# Patient Record
Sex: Female | Born: 1965 | Race: Black or African American | Hispanic: No | Marital: Single | Smoking: Former smoker
Health system: Southern US, Community
[De-identification: ages and names within clinical notes are randomized; demographics above are authoritative.]

## PROBLEM LIST (undated history)

## (undated) DIAGNOSIS — K589 Irritable bowel syndrome without diarrhea: Secondary | ICD-10-CM

## (undated) DIAGNOSIS — F32A Depression, unspecified: Secondary | ICD-10-CM

## (undated) DIAGNOSIS — F3162 Bipolar disorder, current episode mixed, moderate: Secondary | ICD-10-CM

## (undated) DIAGNOSIS — F329 Major depressive disorder, single episode, unspecified: Secondary | ICD-10-CM

## (undated) DIAGNOSIS — N840 Polyp of corpus uteri: Secondary | ICD-10-CM

## (undated) DIAGNOSIS — Z853 Personal history of malignant neoplasm of breast: Secondary | ICD-10-CM

## (undated) DIAGNOSIS — I10 Essential (primary) hypertension: Secondary | ICD-10-CM

## (undated) DIAGNOSIS — Z8742 Personal history of other diseases of the female genital tract: Secondary | ICD-10-CM

## (undated) DIAGNOSIS — F419 Anxiety disorder, unspecified: Secondary | ICD-10-CM

## (undated) HISTORY — DX: Depression, unspecified: F32.A

## (undated) HISTORY — DX: Essential (primary) hypertension: I10

## (undated) HISTORY — DX: Major depressive disorder, single episode, unspecified: F32.9

## (undated) HISTORY — PX: MASTECTOMY: SHX3

---

## 2008-05-06 ENCOUNTER — Emergency Department (HOSPITAL_COMMUNITY): Admission: EM | Admit: 2008-05-06 | Discharge: 2008-05-06 | Payer: Self-pay | Admitting: Emergency Medicine

## 2008-06-05 LAB — CONVERTED CEMR LAB: Pap Smear: NORMAL

## 2008-06-05 LAB — HM PAP SMEAR

## 2009-09-22 ENCOUNTER — Emergency Department (HOSPITAL_COMMUNITY): Admission: EM | Admit: 2009-09-22 | Discharge: 2009-09-22 | Payer: Self-pay | Admitting: Emergency Medicine

## 2009-10-04 ENCOUNTER — Ambulatory Visit: Payer: Self-pay | Admitting: Internal Medicine

## 2009-10-04 DIAGNOSIS — F17200 Nicotine dependence, unspecified, uncomplicated: Secondary | ICD-10-CM

## 2009-10-04 DIAGNOSIS — E669 Obesity, unspecified: Secondary | ICD-10-CM

## 2009-10-04 DIAGNOSIS — F329 Major depressive disorder, single episode, unspecified: Secondary | ICD-10-CM

## 2009-10-04 DIAGNOSIS — I10 Essential (primary) hypertension: Secondary | ICD-10-CM | POA: Insufficient documentation

## 2009-10-04 LAB — CONVERTED CEMR LAB
Alkaline Phosphatase: 58 units/L (ref 39–117)
BUN: 14 mg/dL (ref 6–23)
Basophils Absolute: 0 10*3/uL (ref 0.0–0.1)
Bilirubin, Direct: 0.1 mg/dL (ref 0.0–0.3)
CO2: 29 meq/L (ref 19–32)
Calcium: 9.7 mg/dL (ref 8.4–10.5)
Creatinine, Ser: 0.7 mg/dL (ref 0.4–1.2)
Eosinophils Absolute: 0.1 10*3/uL (ref 0.0–0.7)
Glucose, Bld: 90 mg/dL (ref 70–99)
Hgb A1c MFr Bld: 5.7 % (ref 4.6–6.5)
Lymphocytes Relative: 44.7 % (ref 12.0–46.0)
MCHC: 34.6 g/dL (ref 30.0–36.0)
Monocytes Absolute: 0.4 10*3/uL (ref 0.1–1.0)
Neutrophils Relative %: 41.9 % — ABNORMAL LOW (ref 43.0–77.0)
Platelets: 199 10*3/uL (ref 150.0–400.0)
RBC: 4.55 M/uL (ref 3.87–5.11)
RDW: 12.4 % (ref 11.5–14.6)

## 2009-10-30 ENCOUNTER — Telehealth: Payer: Self-pay | Admitting: Internal Medicine

## 2009-10-31 ENCOUNTER — Encounter: Admission: RE | Admit: 2009-10-31 | Discharge: 2009-10-31 | Payer: Self-pay | Admitting: Obstetrics and Gynecology

## 2009-11-03 ENCOUNTER — Encounter: Payer: Self-pay | Admitting: Internal Medicine

## 2009-11-04 ENCOUNTER — Emergency Department (HOSPITAL_COMMUNITY): Admission: EM | Admit: 2009-11-04 | Discharge: 2009-11-04 | Payer: Self-pay | Admitting: Emergency Medicine

## 2009-11-07 ENCOUNTER — Encounter: Admission: RE | Admit: 2009-11-07 | Discharge: 2009-11-07 | Payer: Self-pay | Admitting: Obstetrics and Gynecology

## 2009-11-07 ENCOUNTER — Ambulatory Visit: Payer: Self-pay | Admitting: Internal Medicine

## 2009-11-07 DIAGNOSIS — E876 Hypokalemia: Secondary | ICD-10-CM | POA: Insufficient documentation

## 2009-11-07 DIAGNOSIS — C50919 Malignant neoplasm of unspecified site of unspecified female breast: Secondary | ICD-10-CM

## 2009-11-07 LAB — CONVERTED CEMR LAB
CO2: 25 meq/L (ref 19–32)
GFR calc non Af Amer: 138.33 mL/min (ref >60)
Glucose, Bld: 90 mg/dL (ref 70–99)
Potassium: 4.3 meq/L (ref 3.5–5.1)
Sodium: 136 meq/L (ref 135–145)

## 2009-11-09 ENCOUNTER — Ambulatory Visit: Payer: Self-pay | Admitting: Genetic Counselor

## 2009-11-09 ENCOUNTER — Ambulatory Visit: Payer: Self-pay | Admitting: Oncology

## 2009-11-10 ENCOUNTER — Ambulatory Visit: Payer: Self-pay | Admitting: Internal Medicine

## 2009-11-10 DIAGNOSIS — F411 Generalized anxiety disorder: Secondary | ICD-10-CM | POA: Insufficient documentation

## 2009-11-14 ENCOUNTER — Encounter: Admission: RE | Admit: 2009-11-14 | Discharge: 2009-11-14 | Payer: Self-pay | Admitting: Surgery

## 2009-11-14 LAB — HM MAMMOGRAPHY

## 2009-11-15 ENCOUNTER — Encounter: Payer: Self-pay | Admitting: Internal Medicine

## 2009-11-15 LAB — CANCER ANTIGEN 27.29: CA 27.29: 26 U/mL (ref 0–39)

## 2009-11-16 ENCOUNTER — Ambulatory Visit: Payer: Self-pay | Admitting: Internal Medicine

## 2009-12-11 ENCOUNTER — Ambulatory Visit: Admission: RE | Admit: 2009-12-11 | Discharge: 2010-01-15 | Payer: Self-pay | Admitting: Radiation Oncology

## 2009-12-18 ENCOUNTER — Encounter (INDEPENDENT_AMBULATORY_CARE_PROVIDER_SITE_OTHER): Payer: Self-pay | Admitting: Surgery

## 2009-12-18 ENCOUNTER — Ambulatory Visit (HOSPITAL_COMMUNITY): Admission: RE | Admit: 2009-12-18 | Discharge: 2009-12-19 | Payer: Self-pay | Admitting: Surgery

## 2009-12-18 HISTORY — PX: MASTECTOMY W/ SENTINEL NODE BIOPSY: SHX2001

## 2010-01-02 ENCOUNTER — Encounter: Payer: Self-pay | Admitting: Internal Medicine

## 2010-01-03 ENCOUNTER — Ambulatory Visit: Payer: Self-pay | Admitting: Internal Medicine

## 2010-01-15 ENCOUNTER — Ambulatory Visit: Payer: Self-pay | Admitting: Genetic Counselor

## 2010-01-16 ENCOUNTER — Encounter: Payer: Self-pay | Admitting: Internal Medicine

## 2010-01-29 ENCOUNTER — Encounter: Admit: 2010-01-29 | Payer: Self-pay | Admitting: Oncology

## 2010-01-30 ENCOUNTER — Emergency Department (HOSPITAL_COMMUNITY)
Admission: EM | Admit: 2010-01-30 | Discharge: 2010-01-30 | Payer: Self-pay | Source: Home / Self Care | Admitting: Emergency Medicine

## 2010-02-19 ENCOUNTER — Encounter
Admission: RE | Admit: 2010-02-19 | Discharge: 2010-03-13 | Payer: Self-pay | Source: Home / Self Care | Attending: Oncology | Admitting: Oncology

## 2010-03-10 ENCOUNTER — Encounter: Payer: Self-pay | Admitting: Surgery

## 2010-03-20 NOTE — Assessment & Plan Note (Signed)
Summary: 1 MO ROV /NWS   Vital Signs:  Patient profile:   45 year old female Menstrual status:  regular Height:      56 inches Weight:      221 pounds BMI:     49.73 O2 Sat:      98 % on Room air Temp:     98.6 degrees F oral Pulse rate:   92 / minute Pulse rhythm:   regular Resp:     16 per minute BP sitting:   160 / 102  (left arm) Cuff size:   large  Vitals Entered By: Bill Salinas CMA (November 07, 2009 8:31 AM)  Nutrition Counseling: Patient's BMI is greater than 25 and therefore counseled on weight management options.  O2 Flow:  Room air CC: pt here for follow up. Pt is not taking Hydrochlorothiazide due to an anaphylactic reaction. She is currently on no meds. She states that she has mammogram a couple of weeks ago and was diagnosed with Breast Cancer on the left side. she states she is sch for surg on Nov 21, 2009/ ab, Hypertension Management Comments Pt is due for a tetanus shot/ ab   Primary Care Provider:  Etta Grandchild MD  CC:  pt here for follow up. Pt is not taking Hydrochlorothiazide due to an anaphylactic reaction. She is currently on no meds. She states that she has mammogram a couple of weeks ago and was diagnosed with Breast Cancer on the left side. she states she is sch for surg on Nov 21, 2009/ ab, and Hypertension Management.  History of Present Illness: She returns for f/up and she informs me that she had a positive biopsy on the left side for breast cancer. She is having surgery in the next 2 weeks. Also, she went to the ER a few days ago for tongue swelling and was found to be allergic to HCTZ and she says that her potassium level was low.  Hypertension History:      She denies headache, chest pain, palpitations, dyspnea with exertion, orthopnea, PND, peripheral edema, visual symptoms, neurologic problems, and syncope.  She notes no problems with any antihypertensive medication side effects.        Positive major cardiovascular risk factors include  hypertension and current tobacco user.  Negative major cardiovascular risk factors include female age less than 17 years old, no history of diabetes or hyperlipidemia, and negative family history for ischemic heart disease.        Further assessment for target organ damage reveals no history of ASHD, cardiac end-organ damage (CHF/LVH), stroke/TIA, peripheral vascular disease, renal insufficiency, or hypertensive retinopathy.     Preventive Screening-Counseling & Management  Alcohol-Tobacco     Alcohol drinks/day: 0     Smoking Status: current     Smoking Cessation Counseling: yes     Smoke Cessation Stage: precontemplative     Packs/Day: 2.0     Year Started: 1985     Pack years: 71     Tobacco Counseling: to quit use of tobacco products  Hep-HIV-STD-Contraception     Hepatitis Risk: no risk noted     HIV Risk: no risk noted     STD Risk: no risk noted     SBE monthly: yes     SBE Education/Counseling: to perform regular SBE      Sexual History:  currently monogamous.        Drug Use:  never and no.        Blood  Transfusions:  no.    Clinical Review Panels:  Prevention   Last Mammogram:  Normal Right, Abnormal Left (10/24/2009)   Last Pap Smear:  normal (06/05/2008)  Immunizations   Last Tetanus Booster:  Tdap (11/07/2009)  Diabetes Management   HgBA1C:  5.7 (10/04/2009)   Creatinine:  0.7 (10/04/2009)  CBC   WBC:  4.1 (10/04/2009)   RBC:  4.55 (10/04/2009)   Hgb:  14.3 (10/04/2009)   Hct:  41.3 (10/04/2009)   Platelets:  199.0 (10/04/2009)   MCV  90.7 (10/04/2009)   MCHC  34.6 (10/04/2009)   RDW  12.4 (10/04/2009)   PMN:  41.9 (10/04/2009)   Lymphs:  44.7 (10/04/2009)   Monos:  10.3 (10/04/2009)   Eosinophils:  2.5 (10/04/2009)   Basophil:  0.6 (10/04/2009)  Complete Metabolic Panel   Glucose:  90 (10/04/2009)   Sodium:  141 (10/04/2009)   Potassium:  3.9 (10/04/2009)   Chloride:  102 (10/04/2009)   CO2:  29 (10/04/2009)   BUN:  14 (10/04/2009)    Creatinine:  0.7 (10/04/2009)   Albumin:  4.3 (10/04/2009)   Total Protein:  8.0 (10/04/2009)   Calcium:  9.7 (10/04/2009)   Total Bili:  0.9 (10/04/2009)   Alk Phos:  58 (10/04/2009)   SGPT (ALT):  19 (10/04/2009)   SGOT (AST):  17 (10/04/2009)   Medications Prior to Update: 1)  Hydrochlorothiazide 25 Mg Tabs (Hydrochlorothiazide) .... Take 1 Tablet By Mouth Once A Day  Current Medications (verified): 1)  Bystolic 5 Mg Tabs (Nebivolol Hcl) .... One By Mouth Once Daily For High Blood Pressure  Allergies (verified): 1)  ! Hydrochlorothiazide  Past History:  Past Medical History: Last updated: 10/04/2009 Depression Hypertension  Past Surgical History: Last updated: 10/04/2009 childbirth X 12  Family History: Last updated: 10/04/2009 Family History of Alcoholism/Addiction Family History of Arthritis Family History Diabetes 1st degree relative Family History Hypertension Family History Ovarian cancer Family History Uterine cancer Family History Emotional/Mental illness  Social History: Last updated: 10/04/2009 Single Current Smoker Alcohol use-no Drug use-no Regular exercise-no Disabled  Risk Factors: Alcohol Use: 0 (11/07/2009) Exercise: no (10/04/2009)  Risk Factors: Smoking Status: current (11/07/2009) Packs/Day: 2.0 (11/07/2009)  Family History: Reviewed history from 10/04/2009 and no changes required. Family History of Alcoholism/Addiction Family History of Arthritis Family History Diabetes 1st degree relative Family History Hypertension Family History Ovarian cancer Family History Uterine cancer Family History Emotional/Mental illness  Social History: Reviewed history from 10/04/2009 and no changes required. Single Current Smoker Alcohol use-no Drug use-no Regular exercise-no Disabled  Review of Systems  The patient denies anorexia, fever, weight loss, weight gain, chest pain, syncope, dyspnea on exertion, peripheral edema, prolonged  cough, headaches, hemoptysis, abdominal pain, hematuria, suspicious skin lesions, enlarged lymph nodes, and angioedema.   General:  Denies chills, fatigue, fever, loss of appetite, malaise, sleep disorder, sweats, weakness, and weight loss. Endo:  Denies cold intolerance, excessive hunger, excessive thirst, excessive urination, heat intolerance, polyuria, and weight change.  Physical Exam  General:  alert, well-developed, well-nourished, well-hydrated, appropriate dress, normal appearance, healthy-appearing, cooperative to examination, good hygiene, and overweight-appearing.   Mouth:  Oral mucosa and oropharynx without lesions or exudates.  Teeth in good repair. Neck:  supple, full ROM, no masses, no thyromegaly, no JVD, normal carotid upstroke, no carotid bruits, no cervical lymphadenopathy, and no neck tenderness.   Lungs:  normal respiratory effort, no intercostal retractions, no accessory muscle use, normal breath sounds, no dullness, no fremitus, no crackles, and no wheezes.   Heart:  normal rate, regular rhythm, no murmur, no gallop, no rub, and no JVD.   Abdomen:  soft, non-tender, normal bowel sounds, no distention, no masses, no guarding, no rigidity, no rebound tenderness, no abdominal hernia, no inguinal hernia, no hepatomegaly, and no splenomegaly.   Msk:  normal ROM, no joint tenderness, no joint swelling, no joint warmth, no redness over joints, no joint deformities, no joint instability, no crepitation, and no muscle atrophy.   Pulses:  R and L carotid,radial,femoral,dorsalis pedis and posterior tibial pulses are full and equal bilaterally Extremities:  No clubbing, cyanosis, edema, or deformity noted with normal full range of motion of all joints.   Neurologic:  No cranial nerve deficits noted. Station and gait are normal. Plantar reflexes are down-going bilaterally. DTRs are symmetrical throughout. Sensory, motor and coordinative functions appear intact. Skin:  turgor normal, color  normal, no rashes, no suspicious lesions, no ecchymoses, no petechiae, no purpura, no ulcerations, and no edema.   Cervical Nodes:  no anterior cervical adenopathy and no posterior cervical adenopathy.   Axillary Nodes:  no R axillary adenopathy and no L axillary adenopathy.   Psych:  Cognition and judgment appear intact. Alert and cooperative with normal attention span and concentration. No apparent delusions, illusions, hallucinations   Impression & Recommendations:  Problem # 1:  ADENOCARCINOMA, LEFT BREAST (ICD-174.9) Assessment New  Orders: Tobacco use cessation intermediate 3-10 minutes (74081)  Problem # 2:  HYPOKALEMIA (ICD-276.8) Assessment: New  Orders: Venipuncture (44818) TLB-BMP (Basic Metabolic Panel-BMET) (80048-METABOL) TLB-Magnesium (Mg) (83735-MG)  Problem # 3:  TOBACCO USE (ICD-305.1) Assessment: Unchanged  Encouraged smoking cessation and discussed different methods for smoking cessation.   Orders: Tobacco use cessation intermediate 3-10 minutes (56314)  Problem # 4:  HYPERTENSION (ICD-401.9) Assessment: Deteriorated  The following medications were removed from the medication list:    Hydrochlorothiazide 25 Mg Tabs (Hydrochlorothiazide) .Marland Kitchen... Take 1 tablet by mouth once a day Her updated medication list for this problem includes:    Bystolic 5 Mg Tabs (Nebivolol hcl) ..... One by mouth once daily for high blood pressure  Orders: Tobacco use cessation intermediate 3-10 minutes (99406)  Complete Medication List: 1)  Bystolic 5 Mg Tabs (Nebivolol hcl) .... One by mouth once daily for high blood pressure  Other Orders: Tdap => 71yrs IM (97026) Admin 1st Vaccine (37858)  Hypertension Assessment/Plan:      The patient's hypertensive risk group is category B: At least one risk factor (excluding diabetes) with no target organ damage.  Today's blood pressure is 160/102.  Her blood pressure goal is < 140/90.  PAP Screening:    Hx Cervical Dysplasia in last  5 yrs? No    3 normal PAP smears in last 5 yrs? Yes    Last PAP smear:  06/05/2008  Mammogram Screening:    Last Mammogram:  10/24/2009  Mammogram Results:    Date of Exam:  10/24/2009    Results:  Normal Right, Abnormal Left  Osteoporosis Risk Assessment:  Risk Factors for Fracture or Low Bone Density:   Smoking status:       current  Immunization & Chemoprophylaxis:    Tetanus vaccine: Tdap  (11/07/2009)  Patient Instructions: 1)  Please schedule a follow-up appointment in 2 weeks. 2)  Tobacco is very bad for your health and your loved ones! You Should stop smoking!. 3)  Stop Smoking Tips: Choose a Quit date. Cut down before the Quit date. decide what you will do as a substitute when you feel the urge to  smoke(gum,toothpick,exercise). 4)  It is important that you exercise regularly at least 20 minutes 5 times a week. If you develop chest pain, have severe difficulty breathing, or feel very tired , stop exercising immediately and seek medical attention. 5)  You need to lose weight. Consider a lower calorie diet and regular exercise.  6)  Check your Blood Pressure regularly. If it is above 140/90: you should make an appointment. Prescriptions: BYSTOLIC 5 MG TABS (NEBIVOLOL HCL) One by mouth once daily for high blood pressure  #70 x 0   Entered and Authorized by:   Etta Grandchild MD   Signed by:   Etta Grandchild MD on 11/07/2009   Method used:   Samples Given   RxID:   [id number]       Immunizations Administered:  Tetanus Vaccine:    Vaccine Type: Tdap    Site: right deltoid    Mfr: GlaxoSmithKline    Dose: 0.5 ml    Route: IM    Given by: Ami Bullins CMA    Exp. Date: 11/09/2011    Lot #: [id number]    VIS given: 01/06/08 version given November 07, 2009.

## 2010-03-20 NOTE — Progress Notes (Signed)
  Phone Note Call from Patient Call back at Home Phone [phone]   Caller: Patient Summary of Call: Patient Jasmin Hernandez requesting a call back to discuss lab results for diabetes.Alvy Beal Archie CMA  October 30, 2009 3:30 PM   Follow-up for Phone Call        diabetes test was normal Follow-up by: Etta Grandchild MD,  October 30, 2009 3:31 PM  Additional Follow-up for Phone Call Additional follow up Details #1::        informed pt Additional Follow-up by: Ami Bullins CMA,  October 30, 2009 5:47 PM

## 2010-03-20 NOTE — Assessment & Plan Note (Signed)
Summary: MEDICATION REACTION/ PER DR MAGRINAT/ HCTZ AND EFFEXOR/ NWS   #   Vital Signs:  Patient profile:   45 year old female Menstrual status:  regular Height:      56 inches Weight:      215 pounds BMI:     48.38 O2 Sat:      99 % on Room air Temp:     98.7 degrees F oral Pulse rate:   76 / minute Pulse rhythm:   regular Resp:     16 per minute BP sitting:   140 / 90  (left arm) Cuff size:   large  Vitals Entered By: Bill Salinas CMA (November 16, 2009 2:36 PM)  Nutrition Counseling: Patient's BMI is greater than 25 and therefore counseled on weight management options.  O2 Flow:  Room air  Primary Care Provider:  Etta Grandchild MD   History of Present Illness: She returns for f/up and she tells me that will probably have bilateral mastectomy soon with Dr. Margaree Mackintosh. She does not like Effexor and she does not like her current Psychiatrist so she asks me for a referral to a new Psychiatrist.  Hypertension History:      She denies headache, chest pain, palpitations, dyspnea with exertion, orthopnea, PND, peripheral edema, visual symptoms, neurologic problems, syncope, and side effects from treatment.  She notes no problems with any antihypertensive medication side effects.        Positive major cardiovascular risk factors include hypertension and current tobacco user.  Negative major cardiovascular risk factors include female age less than 80 years old, no history of diabetes or hyperlipidemia, and negative family history for ischemic heart disease.        Further assessment for target organ damage reveals no history of ASHD, cardiac end-organ damage (CHF/LVH), stroke/TIA, peripheral vascular disease, renal insufficiency, or hypertensive retinopathy.     Current Medications (verified): 1)  Bystolic 5 Mg Tabs (Nebivolol Hcl) .... One By Mouth Once Daily For High Blood Pressure 2)  Klor-Con M20 20 Meq Cr-Tabs (Potassium Chloride Crys Cr) .... Take 1 By Mouth Once Daily 3)  Effexor Xr  75 Mg Xr24h-Cap (Venlafaxine Hcl) .Marland Kitchen.. 1 By Mouth Once Daily 4)  Alprazolam 0.5 Mg Tabs (Alprazolam) .Marland Kitchen.. 1 By Mouth Every 6 Hours As Needed For Anxiety  Allergies (verified): 1)  ! Hydrochlorothiazide  Past History:  Past Medical History: Last updated: 11/10/2009 Depression Hypertension breast cancer, dx 10/2009  Past Surgical History: Last updated: 10/04/2009 childbirth X 12  Family History: Last updated: 10/04/2009 Family History of Alcoholism/Addiction Family History of Arthritis Family History Diabetes 1st degree relative Family History Hypertension Family History Ovarian cancer Family History Uterine cancer Family History Emotional/Mental illness  Social History: Last updated: 10/04/2009 Single Current Smoker Alcohol use-no Drug use-no Regular exercise-no Disabled  Risk Factors: Alcohol Use: 0 (11/07/2009) Exercise: no (10/04/2009)  Risk Factors: Smoking Status: current (11/07/2009) Packs/Day: 2.0 (11/07/2009)  Family History: Reviewed history from 10/04/2009 and no changes required. Family History of Alcoholism/Addiction Family History of Arthritis Family History Diabetes 1st degree relative Family History Hypertension Family History Ovarian cancer Family History Uterine cancer Family History Emotional/Mental illness  Social History: Reviewed history from 10/04/2009 and no changes required. Single Current Smoker Alcohol use-no Drug use-no Regular exercise-no Disabled  Review of Systems  The patient denies anorexia, fever, weight loss, chest pain, syncope, peripheral edema, prolonged cough, headaches, hemoptysis, abdominal pain, difficulty walking, enlarged lymph nodes, and angioedema.   Psych:  Complains of anxiety and depression; denies  easily angered, easily tearful, irritability, mental problems, panic attacks, sense of great danger, suicidal thoughts/plans, thoughts of violence, unusual visions or sounds, and thoughts /plans of harming  others.  Physical Exam  General:  alert, well-developed, well-nourished, and cooperative to examination.   spouse at side Mouth:  Oral mucosa and oropharynx without lesions or exudates.  Teeth in good repair. Lungs:  normal respiratory effort, no intercostal retractions or use of accessory muscles; normal breath sounds bilaterally - no crackles and no wheezes.    Heart:  normal rate, regular rhythm, no murmur, and no rub. BLE without edema. Abdomen:  soft, non-tender, normal bowel sounds, no distention, no masses, no guarding, no rigidity, no rebound tenderness, no abdominal hernia, no inguinal hernia, no hepatomegaly, and no splenomegaly.   Msk:  normal ROM, no joint tenderness, no joint swelling, no joint warmth, no redness over joints, no joint deformities, no joint instability, no crepitation, and no muscle atrophy.   Extremities:  No clubbing, cyanosis, edema, or deformity noted with normal full range of motion of all joints.   Neurologic:  No cranial nerve deficits noted. Station and gait are normal. Plantar reflexes are down-going bilaterally. DTRs are symmetrical throughout. Sensory, motor and coordinative functions appear intact. Skin:  turgor normal, color normal, no rashes, no suspicious lesions, no ecchymoses, no petechiae, no purpura, no ulcerations, and no edema.   Cervical Nodes:  no anterior cervical adenopathy and no posterior cervical adenopathy.   Psych:  Oriented X3, memory intact for recent and remote, normally interactive, and good eye contact.     Impression & Recommendations:  Problem # 1:  HYPERTENSION (ICD-401.9) Assessment Improved  Her updated medication list for this problem includes:    Bystolic 5 Mg Tabs (Nebivolol hcl) ..... One by mouth once daily for high blood pressure  BP today: 140/90 Prior BP: 142/86 (11/10/2009)  Prior 10 Yr Risk Heart Disease: Not enough information (11/07/2009)  Labs Reviewed: K+: 4.3 (11/07/2009) Creat: : 0.6 (11/07/2009)      Problem # 2:  DEPRESSION (ICD-311) Assessment: Unchanged  The following medications were removed from the medication list:    Effexor Xr 75 Mg Xr24h-cap (Venlafaxine hcl) .Marland Kitchen... 1 by mouth once daily Her updated medication list for this problem includes:    Alprazolam 0.5 Mg Tabs (Alprazolam) .Marland Kitchen... 1 by mouth every 6 hours as needed for anxiety  Orders: Psychiatric Referral (Psych)  Discussed treatment options, including trial of antidpressant medication. Will refer to behavioral health. Follow-up call in in 24-48 hours and recheck in 2 weeks, sooner as needed. Patient agrees to call if any worsening of symptoms or thoughts of doing harm arise. Verified that the patient has no suicidal ideation at this time.   Complete Medication List: 1)  Bystolic 5 Mg Tabs (Nebivolol hcl) .... One by mouth once daily for high blood pressure 2)  Klor-con M20 20 Meq Cr-tabs (Potassium chloride crys cr) .... Take 1 by mouth once daily 3)  Alprazolam 0.5 Mg Tabs (Alprazolam) .Marland Kitchen.. 1 by mouth every 6 hours as needed for anxiety  Hypertension Assessment/Plan:      The patient's hypertensive risk group is category B: At least one risk factor (excluding diabetes) with no target organ damage.  Today's blood pressure is 140/90.  Her blood pressure goal is < 140/90.  Patient Instructions: 1)  Please schedule a follow-up appointment in 2 months. 2)  It is important that you exercise regularly at least 20 minutes 5 times a week. If you develop chest pain,  have severe difficulty breathing, or feel very tired , stop exercising immediately and seek medical attention. 3)  You need to lose weight. Consider a lower calorie diet and regular exercise.  4)  Check your Blood Pressure regularly. If it is above 140/90: you should make an appointment.

## 2010-03-20 NOTE — Letter (Signed)
Summary: [facility]  [facility]   Imported By: Lennie Odor 12/05/2009 16:54:38  _____________________________________________________________________  External Attachment:    Type:   Image     Comment:   External Document

## 2010-03-20 NOTE — Assessment & Plan Note (Signed)
Summary: newly has cancer/depression/anxiety/jones/lb   Vital Signs:  Patient profile:   45 year old female Menstrual status:  regular Height:      56 inches (142.24 cm) Weight:      218.0 pounds (99.09 kg) O2 Sat:      98 % on Room air Temp:     99.9 degrees F (37.72 degrees C) oral Pulse rate:   75 / minute BP sitting:   142 / 86  (left arm) Cuff size:   large  Vitals Entered By: Orlan Leavens RMA (November 10, 2009 8:33 AM)  O2 Flow:  Room air CC: Anxiety/ depression Is Patient Diabetic? No Pain Assessment Patient in pain? no        Primary Care Provider:  Etta Grandchild MD  CC:  Anxiety/ depression.  History of Present Illness: here with extreme anxiety and panic attacks -  hx major depression and anxiety (cause for disablity) - new dx breast cancer - bx proven - now with second mass found on MRI 11/07/09 tearful, scared, unable to sleep x 2 nights has crisis line number - denies SI/HI   Current Medications (verified): 1)  Bystolic 5 Mg Tabs (Nebivolol Hcl) .... One By Mouth Once Daily For High Blood Pressure 2)  Klor-Con M20 20 Meq Cr-Tabs (Potassium Chloride Crys Cr) .... Take 1 By Mouth Once Daily  Allergies (verified): 1)  ! Hydrochlorothiazide  Past History:  Past Medical History: Depression Hypertension breast cancer, dx 10/2009  Review of Systems       The patient complains of anorexia.  The patient denies weight loss, syncope, peripheral edema, and headaches.    Physical Exam  General:  alert, well-developed, well-nourished, and cooperative to examination.   spouse at side Lungs:  normal respiratory effort, no intercostal retractions or use of accessory muscles; normal breath sounds bilaterally - no crackles and no wheezes.    Heart:  normal rate, regular rhythm, no murmur, and no rub. BLE without edema. Psych:  very emotional - tearful and very anxiuos during exam   Impression & Recommendations:  Problem # 1:  ANXIETY  (ICD-300.00)  currently situational, but hx same complicating depression resume med tx - prior good results with effecor and xanax - erx same done f/u PCP next week pt will also contact her counselor at [facility] - agrees to same  Her updated medication list for this problem includes:    Effexor Xr 75 Mg Xr24h-cap (Venlafaxine hcl) .Marland Kitchen... 1 by mouth once daily    Alprazolam 0.5 Mg Tabs (Alprazolam) .Marland Kitchen... 1 by mouth every 6 hours as needed for anxiety  Orders: Prescription Created Electronically [phone])  Problem # 2:  DEPRESSION (ICD-311)  Her updated medication list for this problem includes:    Effexor Xr 75 Mg Xr24h-cap (Venlafaxine hcl) .Marland Kitchen... 1 by mouth once daily    Alprazolam 0.5 Mg Tabs (Alprazolam) .Marland Kitchen... 1 by mouth every 6 hours as needed for anxiety  Orders: Prescription Created Electronically [phone])  Problem # 3:  ADENOCARCINOMA, LEFT BREAST (ICD-174.9) MRI reviewed - bx planned for 2nd mass tues 9/27 f/u PCP thereafter  Complete Medication List: 1)  Bystolic 5 Mg Tabs (Nebivolol hcl) .... One by mouth once daily for high blood pressure 2)  Klor-con M20 20 Meq Cr-tabs (Potassium chloride crys cr) .... Take 1 by mouth once daily 3)  Effexor Xr 75 Mg Xr24h-cap (Venlafaxine hcl) .Marland Kitchen.. 1 by mouth once daily 4)  Alprazolam 0.5 Mg Tabs (Alprazolam) .Marland Kitchen.. 1 by mouth every  6 hours as needed for anxiety  Patient Instructions: 1)  it was good to see you today. 2)  medoications for depression and aniety as discussed - your prescriptions have been electronically submitted to your pharmacy. Please take as directed. Contact our office if you believe you're having problems with the medication(s). 3)  contact your couselor for therapy as we discussed - call if you need help arranging this appointment 4)  contact the crisis line or call 911 if things getting worse 5)  Please schedule a follow-up appointment with Dr. Yetta Barre next week to review the biopsy and plans for tretment as  well as medication review; call sooner if problems.  Prescriptions: ALPRAZOLAM 0.5 MG TABS (ALPRAZOLAM) 1 by mouth every 6 hours as needed for anxiety  #40 x 1   Entered and Authorized by:   Newt Lukes MD   Signed by:   Newt Lukes MD on 11/10/2009   Method used:   Printed then faxed to ...       [facility]  [place] [phone]* (retail)       [address]       [place], [place]  [address]       Ph: [phone]       Fax: [phone]   RxID:   [id number] EFFEXOR XR 75 MG XR24H-CAP (VENLAFAXINE HCL) 1 by mouth once daily  #30 x 3   Entered and Authorized by:   Newt Lukes MD   Signed by:   Newt Lukes MD on 11/10/2009   Method used:   Electronically to        [facility]. [phone]* (retail)       [address]       [place], [place]  [address]       Ph: [phone]       Fax: [phone]   RxID:   [id number]

## 2010-03-20 NOTE — Letter (Signed)
Summary: [facility]  [facility]   Imported By: Sherian Rein 01/18/2010 14:34:06  _____________________________________________________________________  External Attachment:    Type:   Image     Comment:   External Document

## 2010-03-20 NOTE — Letter (Signed)
Summary: [facility]   Imported By: Lennie Odor 11/21/2009 14:03:14  _____________________________________________________________________  External Attachment:    Type:   Image     Comment:   External Document

## 2010-03-20 NOTE — Assessment & Plan Note (Signed)
Summary: NEW/ MEDICARE [employer]/ [place]  #   Vital Signs:  Patient profile:   45 year old female Menstrual status:  regular LMP:     09/22/2009 Height:      56 inches Weight:      211.50 pounds BMI:     47.59 O2 Sat:      98 % on Room air Temp:     98.8 degrees F oral Pulse rate:   83 / minute Pulse rhythm:   regular Resp:     16 per minute BP sitting:   122 / 88  (left arm) Cuff size:   large  Vitals Entered By: Rock Nephew CMA (October 04, 2009 8:57 AM)  Nutrition Counseling: Patient's BMI is greater than 25 and therefore counseled on weight management options.  O2 Flow:  Room air CC: New pt c/o R side breast pain and lab order Is Patient Diabetic? No  Does patient need assistance? Functional Status Self care Ambulation Normal LMP (date): 09/22/2009 LMP - Character: normal     Menstrual Status regular Enter LMP: 09/22/2009 Last PAP Date 06/05/2008 Last PAP Result normal   Primary Care Provider:  Etta Grandchild MD  CC:  New pt c/o R side breast pain and lab order.  History of Present Illness:  Hypertension Follow-Up      This is a 45 year old woman who presents for Hypertension follow-up.  The patient denies lightheadedness, urinary frequency, headaches, edema, rash, and fatigue.  The patient denies the following associated symptoms: chest pain, chest pressure, exercise intolerance, dyspnea, palpitations, syncope, leg edema, and pedal edema.  Compliance with medications (by patient report) has been near 100%.  The patient reports that dietary compliance has been poor.  The patient reports no exercise.    Preventive Screening-Counseling & Management  Alcohol-Tobacco     Smoking Status: current     Smoking Cessation Counseling: yes     Smoke Cessation Stage: precontemplative     Packs/Day: 2.0     Year Started: 1985     Pack years: 36     Tobacco Counseling: to quit use of tobacco products  Caffeine-Diet-Exercise     Does Patient Exercise: no   Exercise Counseling: to improve exercise regimen  Hep-HIV-STD-Contraception     Hepatitis Risk: no risk noted     HIV Risk: no risk noted     STD Risk: no risk noted     SBE monthly: yes     SBE Education/Counseling: to perform regular SBE  Safety-Violence-Falls     Seat Belt Use: yes     Helmet Use: yes     Firearms in the Home: no firearms in the home     Smoke Detectors: yes     Violence in the Home: no risk noted      Sexual History:  currently monogamous.        Drug Use:  never and no.        Blood Transfusions:  no.    Medications Prior to Update: 1)  None  Current Medications (verified): 1)  Hydrochlorothiazide 25 Mg Tabs (Hydrochlorothiazide) .... Take 1 Tablet By Mouth Once A Day  Allergies (verified): No Known Drug Allergies  Past History:  Family History: Last updated: 10/04/2009 Family History of Alcoholism/Addiction Family History of Arthritis Family History Diabetes 1st degree relative Family History Hypertension Family History Ovarian cancer Family History Uterine cancer Family History Emotional/Mental illness  Social History: Last updated: 10/04/2009 Single Current Smoker Alcohol use-no Drug  use-no Regular exercise-no Disabled  Risk Factors: Exercise: no (10/04/2009)  Risk Factors: Smoking Status: current (10/04/2009) Packs/Day: 2.0 (10/04/2009)  Past Medical History: Depression Hypertension  Past Surgical History: childbirth X 12  Family History: Reviewed history and no changes required. Family History of Alcoholism/Addiction Family History of Arthritis Family History Diabetes 1st degree relative Family History Hypertension Family History Ovarian cancer Family History Uterine cancer Family History Emotional/Mental illness  Social History: Reviewed history and no changes required. Single Current Smoker Alcohol use-no Drug use-no Regular exercise-no DisabledSmoking Status:  current Drug Use:  never, no Does Patient  Exercise:  no Education:  9-12 yrs Seat Belt Use:  yes Packs/Day:  2.0 Hepatitis Risk:  no risk noted HIV Risk:  no risk noted STD Risk:  no risk noted Sexual History:  currently monogamous Blood Transfusions:  no  Review of Systems       The patient complains of weight gain.  The patient denies anorexia, fever, weight loss, chest pain, syncope, dyspnea on exertion, peripheral edema, prolonged cough, headaches, hemoptysis, abdominal pain, hematuria, suspicious skin lesions, difficulty walking, depression, abnormal bleeding, enlarged lymph nodes, angioedema, and breast masses.   Endo:  Complains of excessive thirst and weight change; denies cold intolerance, excessive hunger, excessive urination, heat intolerance, and polyuria.  Physical Exam  General:  alert, well-developed, well-nourished, well-hydrated, appropriate dress, normal appearance, healthy-appearing, cooperative to examination, good hygiene, and overweight-appearing.   Head:  normocephalic, atraumatic, no abnormalities observed, and no abnormalities palpated.   Eyes:  No corneal or conjunctival inflammation noted. EOMI. Perrla. Funduscopic exam benign, without hemorrhages, exudates or papilledema. Vision grossly normal. Mouth:  Oral mucosa and oropharynx without lesions or exudates.  Teeth in good repair. Neck:  supple, full ROM, no masses, no thyromegaly, no JVD, normal carotid upstroke, no carotid bruits, no cervical lymphadenopathy, and no neck tenderness.   Chest Wall:  no deformities, no tenderness, and no mass.   Breasts:  burn scar below right areola. no masses, no abnormal thickening, no nipple discharge, no tenderness, and no adenopathy.   Lungs:  normal respiratory effort, no intercostal retractions, no accessory muscle use, normal breath sounds, no dullness, no fremitus, no crackles, and no wheezes.   Heart:  normal rate, regular rhythm, no murmur, no gallop, no rub, and no JVD.   Abdomen:  soft, non-tender, normal  bowel sounds, no distention, no masses, no guarding, no rigidity, no rebound tenderness, no abdominal hernia, no inguinal hernia, no hepatomegaly, and no splenomegaly.   Msk:  normal ROM, no joint tenderness, no joint swelling, no joint warmth, no redness over joints, no joint deformities, no joint instability, no crepitation, and no muscle atrophy.   Pulses:  R and L carotid,radial,femoral,dorsalis pedis and posterior tibial pulses are full and equal bilaterally Extremities:  No clubbing, cyanosis, edema, or deformity noted with normal full range of motion of all joints.   Neurologic:  No cranial nerve deficits noted. Station and gait are normal. Plantar reflexes are down-going bilaterally. DTRs are symmetrical throughout. Sensory, motor and coordinative functions appear intact. Skin:  turgor normal, color normal, no rashes, no suspicious lesions, no ecchymoses, no petechiae, no purpura, no ulcerations, and no edema.   Cervical Nodes:  no anterior cervical adenopathy and no posterior cervical adenopathy.   Axillary Nodes:  no R axillary adenopathy and no L axillary adenopathy.   Inguinal Nodes:  no R inguinal adenopathy and no L inguinal adenopathy.   Psych:  Cognition and judgment appear intact. Alert and cooperative with normal  attention span and concentration. No apparent delusions, illusions, hallucinations   Impression & Recommendations:  Problem # 1:  HYPERTENSION (ICD-401.9) Assessment Improved  Her updated medication list for this problem includes:    Hydrochlorothiazide 25 Mg Tabs (Hydrochlorothiazide) .Marland Kitchen... Take 1 tablet by mouth once a day  Orders: Venipuncture (21308) TLB-BMP (Basic Metabolic Panel-BMET) (80048-METABOL) TLB-CBC Platelet - w/Differential (85025-CBCD) TLB-Hepatic/Liver Function Pnl (80076-HEPATIC) TLB-TSH (Thyroid Stimulating Hormone) (84443-TSH) TLB-A1C / Hgb A1C (Glycohemoglobin) (83036-A1C) Tobacco use cessation intermediate 3-10 minutes (99406)  BP today:  122/88  Problem # 2:  OBESITY (ICD-278.00) Assessment: New  Orders: Venipuncture (65784) TLB-BMP (Basic Metabolic Panel-BMET) (80048-METABOL) TLB-CBC Platelet - w/Differential (85025-CBCD) TLB-Hepatic/Liver Function Pnl (80076-HEPATIC) TLB-TSH (Thyroid Stimulating Hormone) (84443-TSH) TLB-A1C / Hgb A1C (Glycohemoglobin) (83036-A1C)  Ht: 56 (10/04/2009)   Wt: 211.50 (10/04/2009)   BMI: 47.59 (10/04/2009)  Problem # 3:  TOBACCO USE (ICD-305.1) Assessment: New  Encouraged smoking cessation and discussed different methods for smoking cessation.   Orders: Tobacco use cessation intermediate 3-10 minutes (69629)  Problem # 4:  MASTODYNIA (ICD-611.71) she says that a mammogram will be done in 3 weeks  Complete Medication List: 1)  Hydrochlorothiazide 25 Mg Tabs (Hydrochlorothiazide) .... Take 1 tablet by mouth once a day  Patient Instructions: 1)  Please schedule a follow-up appointment in 1 month. 2)  Limit your Sodium (Salt) to less than 4 grams a day (slightly less than 1 teaspoon) to prevent fluid retention, swelling, or worsening or symptoms. 3)  Tobacco is very bad for your health and your loved ones! You Should stop smoking!. 4)  Stop Smoking Tips: Choose a Quit date. Cut down before the Quit date. decide what you will do as a substitute when you feel the urge to smoke(gum,toothpick,exercise). 5)  It is important that you exercise regularly at least 20 minutes 5 times a week. If you develop chest pain, have severe difficulty breathing, or feel very tired , stop exercising immediately and seek medical attention. 6)  You need to lose weight. Consider a lower calorie diet and regular exercise.  7)  Schedule your mammogram. 8)  You need to have a Pap Smear to prevent cervical cancer. 9)  If you could be exposed to sexually transmitted diseases, you should use a condom. 10)  If you are having sex and you or your partner don't want a child, use contraception. 11)  Check your Blood  Pressure regularly. If it is above 140/90: you should make an appointment. Prescriptions: HYDROCHLOROTHIAZIDE 25 MG TABS (HYDROCHLOROTHIAZIDE) Take 1 tablet by mouth once a day  #30 x 11   Entered and Authorized by:   Etta Grandchild MD   Signed by:   Etta Grandchild MD on 10/04/2009   Method used:   Electronically to        [facility]. [phone]* (retail)       [address]       [place], [place]  [address]       Ph: [phone]       Fax: [phone]   RxID:   [id number]   Preventive Care Screening  Pap Smear:    Date:  06/05/2008    Results:  normal

## 2010-03-20 NOTE — Assessment & Plan Note (Signed)
Summary: post surgery follow up-lb   Vital Signs:  Patient profile:   45 year old female Menstrual status:  regular Height:      56 inches Weight:      219 pounds BMI:     49.28 O2 Sat:      98 % on Room air Temp:     98.6 degrees F oral Pulse rate:   70 / minute Pulse rhythm:   regular Resp:     16 per minute BP sitting:   120 / 78  (left arm) Cuff size:   large  Vitals Entered By: Rock Nephew CMA (January 03, 2010 9:55 AM)  O2 Flow:  Room air CC: follow-up visit//med refill, Hypertension Management Is Patient Diabetic? No Pain Assessment Patient in pain? no        Primary Care Jesly Hartmann:  Etta Grandchild MD  CC:  follow-up visit//med refill and Hypertension Management.  History of Present Illness: She returns for f/up and is having some post-op pain in the left breast after a mastectomy. She says that she will only need Tamoxifen for ongoing treatment. She wants a refill on Percocet.  She needs a new Psych. referral, previous one did not accept Medicaid. She has mild persistent anxiety.     Hypertension History:      She denies headache, chest pain, palpitations, dyspnea with exertion, orthopnea, PND, peripheral edema, visual symptoms, neurologic problems, syncope, and side effects from treatment.  She notes no problems with any antihypertensive medication side effects.        Positive major cardiovascular risk factors include hypertension and current tobacco user.  Negative major cardiovascular risk factors include female age less than 62 years old, no history of diabetes or hyperlipidemia, and negative family history for ischemic heart disease.        Further assessment for target organ damage reveals no history of ASHD, cardiac end-organ damage (CHF/LVH), stroke/TIA, peripheral vascular disease, renal insufficiency, or hypertensive retinopathy.     Preventive Screening-Counseling & Management  Alcohol-Tobacco     Alcohol drinks/day: 0     Alcohol  Counseling: not indicated; patient does not drink     Smoking Status: current     Smoking Cessation Counseling: yes     Smoke Cessation Stage: precontemplative     Packs/Day: 2.0     Year Started: 1985     Pack years: 30     Tobacco Counseling: to quit use of tobacco products  Hep-HIV-STD-Contraception     Hepatitis Risk: no risk noted     HIV Risk: no risk noted     STD Risk: no risk noted     SBE monthly: yes     SBE Education/Counseling: to perform regular SBE      Sexual History:  currently monogamous.        Drug Use:  never and no.        Blood Transfusions:  no.    Clinical Review Panels:  Prevention   Last Mammogram:  00206.0^MM DIGITAL DIAGNOSTIC UNILAT L (11/14/2009)   Last Pap Smear:  normal (06/05/2008)  Immunizations   Last Tetanus Booster:  Tdap (11/07/2009)  Diabetes Management   HgBA1C:  5.7 (10/04/2009)   Creatinine:  0.6 (11/07/2009)  CBC   WBC:  4.1 (10/04/2009)   RBC:  4.55 (10/04/2009)   Hgb:  14.3 (10/04/2009)   Hct:  41.3 (10/04/2009)   Platelets:  199.0 (10/04/2009)   MCV  90.7 (10/04/2009)   MCHC  34.6 (10/04/2009)  RDW  12.4 (10/04/2009)   PMN:  41.9 (10/04/2009)   Lymphs:  44.7 (10/04/2009)   Monos:  10.3 (10/04/2009)   Eosinophils:  2.5 (10/04/2009)   Basophil:  0.6 (10/04/2009)  Complete Metabolic Panel   Glucose:  90 (11/07/2009)   Sodium:  136 (11/07/2009)   Potassium:  4.3 (11/07/2009)   Chloride:  102 (11/07/2009)   CO2:  25 (11/07/2009)   BUN:  13 (11/07/2009)   Creatinine:  0.6 (11/07/2009)   Albumin:  4.3 (10/04/2009)   Total Protein:  8.0 (10/04/2009)   Calcium:  9.1 (11/07/2009)   Total Bili:  0.9 (10/04/2009)   Alk Phos:  58 (10/04/2009)   SGPT (ALT):  19 (10/04/2009)   SGOT (AST):  17 (10/04/2009)   Medications Prior to Update: 1)  Bystolic 5 Mg Tabs (Nebivolol Hcl) .... One By Mouth Once Daily For High Blood Pressure 2)  Klor-Con M20 20 Meq Cr-Tabs (Potassium Chloride Crys Cr) .... Take 1 By Mouth Once  Daily 3)  Alprazolam 0.5 Mg Tabs (Alprazolam) .Marland Kitchen.. 1 By Mouth Every 6 Hours As Needed For Anxiety  Current Medications (verified): 1)  Bystolic 5 Mg Tabs (Nebivolol Hcl) .... One By Mouth Once Daily For High Blood Pressure 2)  Alprazolam 0.5 Mg Tabs (Alprazolam) .Marland Kitchen.. 1 By Mouth Every 6 Hours As Needed For Anxiety 3)  Percocet 5-325 Mg Tabs (Oxycodone-Acetaminophen) .... One By Mouth Qid As Needed For Pain  Allergies (verified): 1)  ! Hydrochlorothiazide  Past History:  Family History: Last updated: 10/04/2009 Family History of Alcoholism/Addiction Family History of Arthritis Family History Diabetes 1st degree relative Family History Hypertension Family History Ovarian cancer Family History Uterine cancer Family History Emotional/Mental illness  Social History: Last updated: 10/04/2009 Single Current Smoker Alcohol use-no Drug use-no Regular exercise-no Disabled  Risk Factors: Alcohol Use: 0 (01/03/2010) Exercise: no (10/04/2009)  Risk Factors: Smoking Status: current (01/03/2010) Packs/Day: 2.0 (01/03/2010)  Past Medical History: Reviewed history from 11/10/2009 and no changes required. Depression Hypertension breast cancer, dx 10/2009  Past Surgical History: childbirth X 12 Mastectomy-left  Family History: Reviewed history from 10/04/2009 and no changes required. Family History of Alcoholism/Addiction Family History of Arthritis Family History Diabetes 1st degree relative Family History Hypertension Family History Ovarian cancer Family History Uterine cancer Family History Emotional/Mental illness  Social History: Reviewed history from 10/04/2009 and no changes required. Single Current Smoker Alcohol use-no Drug use-no Regular exercise-no Disabled  Review of Systems  The patient denies anorexia, fever, weight loss, weight gain, chest pain, syncope, dyspnea on exertion, peripheral edema, prolonged cough, headaches, hemoptysis, abdominal pain,  suspicious skin lesions, and enlarged lymph nodes.   Psych:  Complains of anxiety; denies alternate hallucination ( auditory/visual), depression, easily angered, easily tearful, irritability, mental problems, panic attacks, sense of great danger, suicidal thoughts/plans, thoughts of violence, unusual visions or sounds, and thoughts /plans of harming others.  Physical Exam  General:  alert, well-developed, well-nourished, and cooperative to examination.   spouse at side Head:  normocephalic, atraumatic, no abnormalities observed, and no abnormalities palpated.   Eyes:  vision grossly intact, pupils equal, pupils round, and pupils reactive to light.   Mouth:  Oral mucosa and oropharynx without lesions or exudates.  Teeth in good repair. Neck:  supple, full ROM, no masses, no thyromegaly, no JVD, normal carotid upstroke, no carotid bruits, no cervical lymphadenopathy, and no neck tenderness.   Breasts:  L breast surgically absent- surgical sight looks good with steri-strips in place and no erythema, exudate, ttp, induration, dehiscence. Lungs:  normal respiratory effort, no intercostal retractions, no accessory muscle use, normal breath sounds, no dullness, no fremitus, no crackles, and no wheezes.   Heart:  normal rate, regular rhythm, no murmur, no gallop, no rub, and no JVD.   Abdomen:  soft, non-tender, normal bowel sounds, no distention, no masses, no guarding, no rigidity, no rebound tenderness, no abdominal hernia, no inguinal hernia, no hepatomegaly, and no splenomegaly.   Msk:  normal ROM, no joint tenderness, no joint swelling, no joint warmth, no redness over joints, no joint deformities, no joint instability, no crepitation, and no muscle atrophy.   Pulses:  R and L carotid,radial,femoral,dorsalis pedis and posterior tibial pulses are full and equal bilaterally Extremities:  No clubbing, cyanosis, edema, or deformity noted with normal full range of motion of all joints.   Neurologic:  No  cranial nerve deficits noted. Station and gait are normal. Plantar reflexes are down-going bilaterally. DTRs are symmetrical throughout. Sensory, motor and coordinative functions appear intact. Skin:  turgor normal, color normal, no rashes, no suspicious lesions, no ecchymoses, no petechiae, no purpura, no ulcerations, and no edema.   Cervical Nodes:  no anterior cervical adenopathy and no posterior cervical adenopathy.   Axillary Nodes:  no R axillary adenopathy and no L axillary adenopathy.   Psych:  Oriented X3, memory intact for recent and remote, normally interactive, and good eye contact.     Impression & Recommendations:  Problem # 1:  ANXIETY (ICD-300.00) Assessment Unchanged  Her updated medication list for this problem includes:    Alprazolam 0.5 Mg Tabs (Alprazolam) .Marland Kitchen... 1 by mouth every 6 hours as needed for anxiety  Orders: Psychiatric Referral (Psych)  Problem # 2:  HYPERTENSION (ICD-401.9) Assessment: Improved  Her updated medication list for this problem includes:    Bystolic 5 Mg Tabs (Nebivolol hcl) ..... One by mouth once daily for high blood pressure  BP today: 120/78 Prior BP: 140/90 (11/16/2009)  Prior 10 Yr Risk Heart Disease: Not enough information (11/07/2009)  Labs Reviewed: K+: 4.3 (11/07/2009) Creat: : 0.6 (11/07/2009)     Orders: Tobacco use cessation intermediate 3-10 minutes (99406)  Problem # 3:  ADENOCARCINOMA, LEFT BREAST (ICD-174.9) Assessment: Unchanged  Complete Medication List: 1)  Bystolic 5 Mg Tabs (Nebivolol hcl) .... One by mouth once daily for high blood pressure 2)  Alprazolam 0.5 Mg Tabs (Alprazolam) .Marland Kitchen.. 1 by mouth every 6 hours as needed for anxiety 3)  Percocet 5-325 Mg Tabs (Oxycodone-acetaminophen) .... One by mouth qid as needed for pain  Hypertension Assessment/Plan:      The patient's hypertensive risk group is category B: At least one risk factor (excluding diabetes) with no target organ damage.  Today's blood  pressure is 120/78.  Her blood pressure goal is < 140/90.  Patient Instructions: 1)  Please schedule a follow-up appointment in 4 months. 2)  Tobacco is very bad for your health and your loved ones! You Should stop smoking!. 3)  Stop Smoking Tips: Choose a Quit date. Cut down before the Quit date. decide what you will do as a substitute when you feel the urge to smoke(gum,toothpick,exercise). 4)  It is important that you exercise regularly at least 20 minutes 5 times a week. If you develop chest pain, have severe difficulty breathing, or feel very tired , stop exercising immediately and seek medical attention. 5)  You need to lose weight. Consider a lower calorie diet and regular exercise.  6)  Check your Blood Pressure regularly. If it is above 140/90: you  should make an appointment. Prescriptions: ALPRAZOLAM 0.5 MG TABS (ALPRAZOLAM) 1 by mouth every 6 hours as needed for anxiety  #40 x 5   Entered and Authorized by:   Etta Grandchild MD   Signed by:   Etta Grandchild MD on 01/03/2010   Method used:   Print then Give to Patient   RxID:   [id number] PERCOCET 5-325 MG TABS (OXYCODONE-ACETAMINOPHEN) One by mouth QID as needed for pain  #50 x 0   Entered and Authorized by:   Etta Grandchild MD   Signed by:   Etta Grandchild MD on 01/03/2010   Method used:   Print then Give to Patient   RxID:   [id number] BYSTOLIC 5 MG TABS (NEBIVOLOL HCL) One by mouth once daily for high blood pressure  #30 x 11   Entered and Authorized by:   Etta Grandchild MD   Signed by:   Etta Grandchild MD on 01/03/2010   Method used:   Electronically to        [facility]. [phone]* (retail)       [address]       [place], [place]  [address]       Ph: [phone]       Fax: [phone]   RxID:   [id number]    Orders Added: 1)  Psychiatric Referral [Psych] 2)  Tobacco use cessation intermediate 3-10 minutes [99406] 3)  Est. Patient Level IV [73220]

## 2010-03-21 ENCOUNTER — Ambulatory Visit: Payer: Self-pay | Admitting: Physical Therapy

## 2010-03-21 ENCOUNTER — Encounter: Payer: Self-pay | Admitting: Physical Therapy

## 2010-03-22 NOTE — Letter (Signed)
Summary: [facility]  [facility]   Imported By: Sherian Rein 02/07/2010 07:48:04  _____________________________________________________________________  External Attachment:    Type:   Image     Comment:   External Document

## 2010-03-27 ENCOUNTER — Other Ambulatory Visit: Payer: Self-pay | Admitting: Oncology

## 2010-03-27 ENCOUNTER — Encounter (HOSPITAL_BASED_OUTPATIENT_CLINIC_OR_DEPARTMENT_OTHER): Payer: Medicare Other | Admitting: Oncology

## 2010-03-27 ENCOUNTER — Encounter: Payer: Self-pay | Admitting: Internal Medicine

## 2010-03-27 DIAGNOSIS — F411 Generalized anxiety disorder: Secondary | ICD-10-CM

## 2010-03-27 DIAGNOSIS — Z1231 Encounter for screening mammogram for malignant neoplasm of breast: Secondary | ICD-10-CM

## 2010-03-27 DIAGNOSIS — C50419 Malignant neoplasm of upper-outer quadrant of unspecified female breast: Secondary | ICD-10-CM

## 2010-03-27 DIAGNOSIS — I10 Essential (primary) hypertension: Secondary | ICD-10-CM

## 2010-03-27 DIAGNOSIS — Z170 Estrogen receptor positive status [ER+]: Secondary | ICD-10-CM

## 2010-03-27 LAB — CBC WITH DIFFERENTIAL/PLATELET
Basophils Absolute: 0 10*3/uL (ref 0.0–0.1)
Eosinophils Absolute: 0 10*3/uL (ref 0.0–0.5)
HGB: 13.1 g/dL (ref 11.6–15.9)
MCV: 87.1 fL (ref 79.5–101.0)
MONO#: 0.3 10*3/uL (ref 0.1–0.9)
MONO%: 6.1 % (ref 0.0–14.0)
NEUT#: 2.1 10*3/uL (ref 1.5–6.5)
Platelets: 202 10*3/uL (ref 145–400)
RBC: 4.35 10*6/uL (ref 3.70–5.45)
RDW: 12.3 % (ref 11.2–14.5)
WBC: 4.8 10*3/uL (ref 3.9–10.3)
nRBC: 0 % (ref 0–0)

## 2010-04-11 NOTE — Letter (Signed)
Summary: [facility]  [facility]   Imported By: Sherian Rein 04/04/2010 13:04:50  _____________________________________________________________________  External Attachment:    Type:   Image     Comment:   External Document

## 2010-04-17 ENCOUNTER — Encounter: Payer: Self-pay | Admitting: Internal Medicine

## 2010-05-01 NOTE — Letter (Signed)
Summary: [facility]   Imported By: Sherian Rein 04/23/2010 10:30:15  _____________________________________________________________________  External Attachment:    Type:   Image     Comment:   External Document

## 2010-05-02 LAB — COMPREHENSIVE METABOLIC PANEL
ALT: 18 U/L (ref 0–35)
AST: 19 U/L (ref 0–37)
Albumin: 4 g/dL (ref 3.5–5.2)
Alkaline Phosphatase: 60 U/L (ref 39–117)
Calcium: 8.9 mg/dL (ref 8.4–10.5)
GFR calc Af Amer: >60 mL/min (ref >60)
Potassium: 3.5 mEq/L (ref 3.5–5.1)
Sodium: 136 mEq/L (ref 135–145)
Total Protein: 7.6 g/dL (ref 6.0–8.3)

## 2010-05-02 LAB — CBC
HCT: 38.7 % (ref 36.0–46.0)
MCHC: 34.4 g/dL (ref 30.0–36.0)
Platelets: 184 10*3/uL (ref 150–400)
RDW: 12.5 % (ref 11.5–15.5)
WBC: 6.6 10*3/uL (ref 4.0–10.5)

## 2010-05-02 LAB — DIFFERENTIAL
Basophils Relative: 0 % (ref 0–1)
Eosinophils Absolute: 0 10*3/uL (ref 0.0–0.7)
Eosinophils Relative: 0 % (ref 0–5)
Lymphs Abs: 2.2 10*3/uL (ref 0.7–4.0)
Monocytes Absolute: 0.3 10*3/uL (ref 0.1–1.0)
Monocytes Relative: 5 % (ref 3–12)
Neutrophils Relative %: 62 % (ref 43–77)

## 2010-05-02 LAB — SURGICAL PCR SCREEN: MRSA, PCR: NEGATIVE

## 2010-05-02 LAB — CANCER ANTIGEN 27.29: CA 27.29: 14 U/mL (ref 0–39)

## 2010-05-03 LAB — BASIC METABOLIC PANEL
CO2: 28 mEq/L (ref 19–32)
Chloride: 101 mEq/L (ref 96–112)
Creatinine, Ser: 0.76 mg/dL (ref 0.4–1.2)
GFR calc Af Amer: >60 mL/min (ref >60)
Glucose, Bld: 140 mg/dL — ABNORMAL HIGH (ref 70–99)

## 2010-05-03 LAB — CBC
Hemoglobin: 13.9 g/dL (ref 12.0–15.0)
MCH: 31.7 pg (ref 26.0–34.0)
MCHC: 34.7 g/dL (ref 30.0–36.0)
MCV: 91.4 fL (ref 78.0–100.0)
Platelets: 164 10*3/uL (ref 150–400)
RBC: 4.37 MIL/uL (ref 3.87–5.11)

## 2010-05-04 LAB — POCT I-STAT, CHEM 8
BUN: 13 mg/dL (ref 6–23)
Calcium, Ion: 1.14 mmol/L (ref 1.12–1.32)
Creatinine, Ser: 0.9 mg/dL (ref 0.4–1.2)
Hemoglobin: 14.6 g/dL (ref 12.0–15.0)
TCO2: 24 mmol/L (ref 0–100)

## 2010-05-10 ENCOUNTER — Ambulatory Visit: Payer: Medicare Other | Admitting: Internal Medicine

## 2010-05-10 DIAGNOSIS — Z0289 Encounter for other administrative examinations: Secondary | ICD-10-CM

## 2010-05-31 LAB — POCT I-STAT, CHEM 8
BUN: 9 mg/dL (ref 6–23)
Chloride: 104 mEq/L (ref 96–112)
Glucose, Bld: 95 mg/dL (ref 70–99)
HCT: 39 % (ref 36.0–46.0)
Potassium: 3.7 mEq/L (ref 3.5–5.1)

## 2010-06-22 ENCOUNTER — Encounter (HOSPITAL_COMMUNITY)
Admission: RE | Admit: 2010-06-22 | Discharge: 2010-06-22 | Disposition: A | Discharge status: Home / Self Care | Payer: Medicare Other | Source: Ambulatory Visit | Attending: Plastic Surgery | Admitting: Plastic Surgery

## 2010-06-22 LAB — CBC
HCT: 38 % (ref 36.0–46.0)
Hemoglobin: 13.5 g/dL (ref 12.0–15.0)
MCV: 86.2 fL (ref 78.0–100.0)
WBC: 5.9 10*3/uL (ref 4.0–10.5)

## 2010-06-22 LAB — COMPREHENSIVE METABOLIC PANEL
ALT: 21 U/L (ref 0–35)
Alkaline Phosphatase: 54 U/L (ref 39–117)
BUN: 10 mg/dL (ref 6–23)
CO2: 26 mEq/L (ref 19–32)
GFR calc non Af Amer: >60 mL/min (ref >60)
Glucose, Bld: 98 mg/dL (ref 70–99)
Potassium: 4 mEq/L (ref 3.5–5.1)
Sodium: 138 mEq/L (ref 135–145)
Total Bilirubin: 0.3 mg/dL (ref 0.3–1.2)

## 2010-06-22 LAB — SURGICAL PCR SCREEN: Staphylococcus aureus: NEGATIVE

## 2010-06-28 ENCOUNTER — Other Ambulatory Visit: Payer: Self-pay | Admitting: Obstetrics and Gynecology

## 2010-06-28 ENCOUNTER — Ambulatory Visit (HOSPITAL_COMMUNITY)
Admission: RE | Admit: 2010-06-28 | Discharge: 2010-06-28 | Disposition: A | Discharge status: Home / Self Care | Payer: Medicare Other | Source: Ambulatory Visit | Attending: Obstetrics and Gynecology | Admitting: Obstetrics and Gynecology

## 2010-06-28 ENCOUNTER — Ambulatory Visit (HOSPITAL_COMMUNITY): Admission: RE | Admit: 2010-06-28 | Payer: Medicare Other | Source: Ambulatory Visit | Admitting: Plastic Surgery

## 2010-06-28 DIAGNOSIS — N839 Noninflammatory disorder of ovary, fallopian tube and broad ligament, unspecified: Secondary | ICD-10-CM | POA: Insufficient documentation

## 2010-06-28 DIAGNOSIS — Z01812 Encounter for preprocedural laboratory examination: Secondary | ICD-10-CM | POA: Insufficient documentation

## 2010-06-28 DIAGNOSIS — Z853 Personal history of malignant neoplasm of breast: Secondary | ICD-10-CM | POA: Insufficient documentation

## 2010-06-28 DIAGNOSIS — Z302 Encounter for sterilization: Secondary | ICD-10-CM | POA: Insufficient documentation

## 2010-06-28 HISTORY — PX: LAPAROSCOPIC TUBAL LIGATION: SUR803

## 2010-06-28 LAB — PROTIME-INR: INR: 1 (ref 0.00–1.49)

## 2010-07-02 NOTE — Op Note (Signed)
Jasmin Hernandez, MATHURIN NO.:  [id number]  MEDICAL RECORD NO.:  [id number]           PATIENT TYPE:  O  LOCATION:  [facility]                          [facility]AC[facility]  PHYSICIAN:  Miguel Aschoff, M.D.       DATE OF BIRTH:  August 10, 1965  DATE OF PROCEDURE:  06/28/2010 DATE OF DISCHARGE:                              OPERATIVE REPORT   PREOPERATIVE DIAGNOSIS:  Desired sterilization.  POSTOPERATIVE DIAGNOSIS:  Desired sterilization with cystic mass involving right ovary.  PROCEDURES: 1. Laparoscopy with bilateral tubal sterilization using fulguration. 2. Peritoneal washings.  SURGEON:  Miguel Aschoff, MD  ANESTHESIA:  General.  COMPLICATIONS:  None.  JUSTIFICATION:  The patient is a 45 year old black female, requesting sterilization for socioeconomic reasons.  The patient also has history of breast cancer and because of this, is unable to use hormone- containing contraceptives.  The risks and benefits of procedure were discussed with the patient including failure rate of 1 chance in 200. Informed consent has been obtained.  DESCRIPTION OF PROCEDURE:  The patient was taken to the operating room, placed in the supine position.  General anesthesia was administered without difficulty.  She was then placed in dorsal lithotomy position, prepped and draped in usual sterile fashion.  Bladder was catheterized and Hulka tenaculum was placed through the cervix and held manipulation. Attention was then directed to the umbilicus where a small infraumbilical incision was made.  A Veress needle was inserted and the abdomen was insufflated with 3 liters of CO2.  Following the insufflation, the trocar to laparoscope was placed followed by laparoscope itself.  Then, under direct visualization, a 5-mm suprapubic port was established for placement of the Kleppinger forceps.  This was done without difficulty.  Systematic inspection showed the anterior bladder peritoneum to be unremarkable.  The  uterus was normal size and shape and anterior.  The left tube was normal along its course.  Fimbria fine and delicate.  The left ovary was completely within normal limits. The right tube was within normal limits.  However, the right ovary contained a cystic mass approximately 5 cm in size, somewhat irregular that had not been previously appreciated.  There was a small amount of fluid in the cul-de-sac.  The appendix was within normal limits.  The liver surface was unremarkable and no other abnormalities were noted in the abdomen.  Peritoneal washings were taken in view of the patient's history of breast carcinoma and now the cystic mass is on the ovary, this was sent for cytology.  Once this was done, Kleppinger forceps were introduced and midportion of each tube was grasped and cauterized for approximately 3 cm.  Both tubes were then divided in the midline without difficulty with good hemostasis.  The cystic mass was photographed.  The plan is for this to be reassessed at her postop check in 4 weeks.  If this persists, the patient may need to undergo a right salpingo- oophorectomy.  Once good hemostasis present with cytology sent, it was elected to complete the procedure.  CO2 was allowed to escape.  All instruments were removed and small incisions were closed with subcuticular 3-0  Vicryl.  The port sites were then injected with 0.25% Marcaine.  The patient was reversed from the anesthetic and brought to recovery room in satisfactory condition.  Plan is also to have a CA-125 level drawn at this time.  Plan is for the patient be discharged home.  MEDICATIONS FOR HOME: 1. Doxycycline 100 mg twice a day x3 days. 2. Vicodin 1 every 3-4 hours as needed for pain.  She will be seen back in 4 weeks for followup examination and ultrasound.  She is to call for any problems such as fever, pain, or heavy bleeding.     Miguel Aschoff, M.D.     AR/MEDQ  D:  06/28/2010  T:  06/28/2010  Job:   [id number]  Electronically Signed by Miguel Aschoff M.D. on 07/02/2010 09:10:49 AM

## 2010-07-24 ENCOUNTER — Encounter (INDEPENDENT_AMBULATORY_CARE_PROVIDER_SITE_OTHER): Payer: Self-pay | Admitting: Surgery

## 2010-09-06 ENCOUNTER — Encounter (INDEPENDENT_AMBULATORY_CARE_PROVIDER_SITE_OTHER): Payer: Medicare Other | Admitting: Surgery

## 2010-09-17 ENCOUNTER — Encounter (INDEPENDENT_AMBULATORY_CARE_PROVIDER_SITE_OTHER): Payer: Self-pay | Admitting: Surgery

## 2010-09-19 ENCOUNTER — Encounter (INDEPENDENT_AMBULATORY_CARE_PROVIDER_SITE_OTHER): Payer: Self-pay | Admitting: Surgery

## 2010-09-19 ENCOUNTER — Ambulatory Visit (INDEPENDENT_AMBULATORY_CARE_PROVIDER_SITE_OTHER): Payer: Medicare Other | Admitting: Surgery

## 2010-09-19 VITALS — BP 150/98 | HR 84 | Temp 97.6°F | Ht 66.0 in | Wt 220.2 lb

## 2010-09-19 DIAGNOSIS — C50912 Malignant neoplasm of unspecified site of left female breast: Secondary | ICD-10-CM

## 2010-09-19 DIAGNOSIS — N6310 Unspecified lump in the right breast, unspecified quadrant: Secondary | ICD-10-CM | POA: Insufficient documentation

## 2010-09-19 DIAGNOSIS — C50919 Malignant neoplasm of unspecified site of unspecified female breast: Secondary | ICD-10-CM

## 2010-09-19 DIAGNOSIS — N63 Unspecified lump in unspecified breast: Secondary | ICD-10-CM
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
## 2010-09-19 NOTE — Patient Instructions (Signed)
We will schedule you for a right mammogram/ ultrasound.

## 2010-09-26 ENCOUNTER — Ambulatory Visit
Admission: RE | Admit: 2010-09-26 | Discharge: 2010-09-26 | Disposition: A | Discharge status: Home / Self Care | Payer: Medicare Other | Source: Ambulatory Visit | Attending: Surgery | Admitting: Surgery

## 2010-09-26 DIAGNOSIS — N6310 Unspecified lump in the right breast, unspecified quadrant: Secondary | ICD-10-CM

## 2010-10-02 ENCOUNTER — Telehealth (INDEPENDENT_AMBULATORY_CARE_PROVIDER_SITE_OTHER): Payer: Self-pay | Admitting: Surgery

## 2010-10-02 ENCOUNTER — Telehealth (INDEPENDENT_AMBULATORY_CARE_PROVIDER_SITE_OTHER): Payer: Self-pay | Admitting: General Surgery

## 2010-10-02 NOTE — Telephone Encounter (Signed)
Called Dr Kelly Splinter Office back on Jasmin Hernandez and talked to Jasmin Hernandez and she stated that Dr Kelly Splinter is moving Jasmin Hernandez surgery date from 10/11/10 [facility] she is waiting on Dr Corliss Skains to coordinate with you. Jasmin Hernandez has a office appt to come back in to see you on 10/19/10 @ 2:40 to go over mgm and u/s

## 2010-10-17 ENCOUNTER — Encounter (INDEPENDENT_AMBULATORY_CARE_PROVIDER_SITE_OTHER): Payer: Self-pay | Admitting: Surgery

## 2010-10-19 ENCOUNTER — Ambulatory Visit (INDEPENDENT_AMBULATORY_CARE_PROVIDER_SITE_OTHER): Payer: Medicare Other | Admitting: Surgery

## 2010-10-19 ENCOUNTER — Encounter (INDEPENDENT_AMBULATORY_CARE_PROVIDER_SITE_OTHER): Payer: Self-pay | Admitting: Surgery

## 2010-10-19 VITALS — BP 124/81 | HR 71

## 2010-10-19 DIAGNOSIS — N6310 Unspecified lump in the right breast, unspecified quadrant: Secondary | ICD-10-CM

## 2010-10-19 DIAGNOSIS — N63 Unspecified lump in unspecified breast: Secondary | ICD-10-CM

## 2010-10-19 NOTE — Progress Notes (Signed)
The patient's right breast ultrasound showed only a fatty lobular mass in the retroareolar region.  The patient reports some tenderness in this area.  She is scheduled for left breast reconstruction by Dr. Kelly Splinter on Sept 27.  After discussion with the patient, we will plan a right excisional breast biopsy at the same time.  The surgical procedure has been discussed with the patient.  Potential risks, benefits, alternative treatments, and expected outcomes have been explained.  All of the patient's questions at this time have been answered.  The patient understand the proposed surgical procedure and wishes to proceed.   We will coordinate scheduling with Dr. Leonie Green office.

## 2010-10-19 NOTE — Patient Instructions (Signed)
We will coordinate scheduling with Dr. Leonie Green office for a right breast biopsy.

## 2010-10-23 ENCOUNTER — Ambulatory Visit: Payer: Medicare Other

## 2010-11-14 ENCOUNTER — Ambulatory Visit: Payer: Medicare Other | Admitting: Internal Medicine

## 2010-11-14 DIAGNOSIS — Z0289 Encounter for other administrative examinations: Secondary | ICD-10-CM

## 2010-11-15 ENCOUNTER — Ambulatory Visit (HOSPITAL_BASED_OUTPATIENT_CLINIC_OR_DEPARTMENT_OTHER): Admission: RE | Admit: 2010-11-15 | Payer: Medicare Other | Source: Ambulatory Visit | Admitting: Plastic Surgery

## 2010-11-20 ENCOUNTER — Other Ambulatory Visit: Payer: Self-pay | Admitting: Obstetrics and Gynecology

## 2010-11-22 ENCOUNTER — Other Ambulatory Visit: Payer: Self-pay | Admitting: Obstetrics and Gynecology

## 2010-11-30 ENCOUNTER — Encounter (HOSPITAL_COMMUNITY): Payer: Self-pay

## 2010-11-30 ENCOUNTER — Encounter (HOSPITAL_COMMUNITY)
Admission: RE | Admit: 2010-11-30 | Discharge: 2010-11-30 | Disposition: A | Discharge status: Home / Self Care | Payer: Medicare Other | Source: Ambulatory Visit | Attending: Obstetrics and Gynecology | Admitting: Obstetrics and Gynecology

## 2010-11-30 ENCOUNTER — Other Ambulatory Visit (HOSPITAL_COMMUNITY): Payer: PRIVATE HEALTH INSURANCE

## 2010-11-30 LAB — BASIC METABOLIC PANEL
CO2: 29 mEq/L (ref 19–32)
Calcium: 9.3 mg/dL (ref 8.4–10.5)
Chloride: 101 mEq/L (ref 96–112)
Creatinine, Ser: 0.7 mg/dL (ref 0.50–1.10)
Glucose, Bld: 93 mg/dL (ref 70–99)
Sodium: 137 mEq/L (ref 135–145)

## 2010-11-30 LAB — CBC
HCT: 37.5 % (ref 36.0–46.0)
MCH: 29.8 pg (ref 26.0–34.0)
MCV: 89.3 fL (ref 78.0–100.0)
Platelets: 165 10*3/uL (ref 150–400)
RBC: 4.2 MIL/uL (ref 3.87–5.11)
WBC: 7 10*3/uL (ref 4.0–10.5)

## 2010-11-30 NOTE — Patient Instructions (Signed)
   Your procedure is scheduled on:12/07/10  Enter through the Main Entrance of [facility] at:0600 Pick up the phone at the desk and dial [phone] and inform us of your arrival.  Please call this number if you have any problems the morning of surgery: [phone]  Remember: Do not eat food after midnight:Thursday Do not drink clear liquids after:midnight Thursday Take these medicines the morning of surgery with a SIP OF WATER:Bystolic  Do not wear jewelry, make-up, or FINGER nail polish Do not wear lotions, powders, or perfumes.  You may wear deodorant. Do not shave 48 hours prior to surgery. Do not bring valuables to the hospital.  Leave suitcase in the car. After Surgery it may be brought to your room. For patients being admitted to the hospital, checkout time is 11:00am the day of discharge.  Patients discharged on the day of surgery will not be allowed to drive home.   Name and phone number of your driver: Jasmin Hernandez -[phone]  Remember to use your hibiclens as instructed.Please shower with 1/2 bottle the evening before your surgery and the other 1/2 bottle the morning of surgery.

## 2010-12-03 LAB — MRSA CULTURE

## 2010-12-06 NOTE — H&P (Signed)
Jasmin Hernandez is an 45 y.o. female. Presents with a history of breast cancer and was discovered to have bilateral adnexal masses. A CA 125 level was obtained and was within normal limits and the masses have been followed conservatively to see if resolution would occur but they have persisted. On ultrasound the left mass is 5.5 X 5.8 cms and on the right several cysts are present the largest 3.8 cm. The patient is currently on Tamoxifen treatment to reduce estrogen levels but in view of these persistent ovarian masses she is now being admitted to undergo bilateral salpingo oophorectomy for both diagnostic and therapeutic reasons. Prior intra abdominal cytology was negative.  Pertinent Gynecological History:  Contraception: tubal ligation DES exposure: denies Blood transfusions: none Sexually transmitted diseases: no past history Previous GYN Procedures: BTL  Last pap: normal Date: 11/20/2010 OB History: G11, P11-0-0-12  Menstrual History:  No LMP recorded.    Past Medical History  Diagnosis Date  . Hypertension   . Cancer 10/2009    breast  . Depression     h/o bipolar- no meds now    Past Surgical History  Procedure Date  . Mastectomy     left  . Breast surgery   . Tubal ligation     Family History  Problem Relation Age of Onset  . Mental retardation Other   . Hypertension Other   . Drug abuse Other   . Diabetes Other   . Arthritis Other   . Alcohol abuse Other   . Cancer Other     ovarian and uterine  . Cancer Mother   . Cancer Sister     Social History:  reports that she has been smoking Cigarettes.  She has been smoking about .25 packs per day. She has quit using smokeless tobacco. She reports that she drinks alcohol. She reports that she does not use illicit drugs.  Allergies:  Allergies  Allergen Reactions  . Hydrochlorothiazide Swelling    Tongue swells    No prescriptions prior to admission    Review of Systems  Constitutional: Negative.  Negative  for fever, chills and weight loss.  HENT: Negative.   Eyes: Negative.   Respiratory: Negative.  Negative for cough, hemoptysis and sputum production.   Cardiovascular: Negative.  Negative for chest pain and palpitations.  Gastrointestinal: Negative.  Negative for heartburn, nausea, vomiting, abdominal pain, diarrhea and constipation.  Genitourinary: Negative.  Negative for dysuria and urgency.  Musculoskeletal: Negative.   Skin: Negative.   Neurological: Negative.   Endo/Heme/Allergies: Negative.   Psychiatric/Behavioral: The patient is nervous/anxious.     There were no vitals taken for this visit. Physical Exam  Constitutional: She is oriented to person, place, and time. She appears well-developed and well-nourished.  HENT:  Head: Normocephalic and atraumatic.  Eyes: Conjunctivae are normal. Pupils are equal, round, and reactive to light.  Neck: Normal range of motion. Neck supple. No thyromegaly present.  Cardiovascular: Normal rate, regular rhythm and normal heart sounds.   Respiratory: Effort normal and breath sounds normal.  GI: Soft. Bowel sounds are normal. She exhibits no distension. There is no tenderness. There is no rebound and no guarding.  Musculoskeletal: Normal range of motion.  Neurological: She is alert and oriented to person, place, and time.  Skin: Skin is warm and dry.  Gyn: External genitalia wnl, BUS wnl, Vagina without gross lesion normal discharge, Cervix without lesion, Uterus anterior normal size shape and position, Adnexa mass on right 5 to 6 cm in size.  Fullness on left,  No results found for this or any previous visit (from the past 24 hour(s)).  No results found.  Assessment/Plan: Bilateral adnexal masses in patient with breast CA. 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

## 2010-12-07 ENCOUNTER — Ambulatory Visit (HOSPITAL_COMMUNITY)
Admission: RE | Admit: 2010-12-07 | Discharge: 2010-12-08 | Disposition: A | Discharge status: Home / Self Care | Payer: Medicare Other | Source: Ambulatory Visit | Attending: Obstetrics and Gynecology | Admitting: Obstetrics and Gynecology

## 2010-12-07 ENCOUNTER — Encounter (HOSPITAL_COMMUNITY): Payer: Self-pay | Admitting: Anesthesiology

## 2010-12-07 ENCOUNTER — Other Ambulatory Visit: Payer: Self-pay | Admitting: Obstetrics and Gynecology

## 2010-12-07 ENCOUNTER — Encounter (HOSPITAL_COMMUNITY)
Admission: RE | Disposition: A | Discharge status: Home / Self Care | Payer: Self-pay | Source: Ambulatory Visit | Attending: Obstetrics and Gynecology

## 2010-12-07 ENCOUNTER — Ambulatory Visit (HOSPITAL_COMMUNITY): Payer: Medicare Other | Admitting: Anesthesiology

## 2010-12-07 ENCOUNTER — Encounter (HOSPITAL_COMMUNITY): Payer: Self-pay | Admitting: *Deleted

## 2010-12-07 DIAGNOSIS — N83299 Other ovarian cyst, unspecified side: Secondary | ICD-10-CM

## 2010-12-07 DIAGNOSIS — Z01818 Encounter for other preprocedural examination: Secondary | ICD-10-CM | POA: Insufficient documentation

## 2010-12-07 DIAGNOSIS — D279 Benign neoplasm of unspecified ovary: Secondary | ICD-10-CM | POA: Insufficient documentation

## 2010-12-07 DIAGNOSIS — Z01812 Encounter for preprocedural laboratory examination: Secondary | ICD-10-CM | POA: Insufficient documentation

## 2010-12-07 HISTORY — PX: LAPAROSCOPY: SHX197

## 2010-12-07 HISTORY — PX: SALPINGOOPHORECTOMY: SHX82

## 2010-12-07 LAB — APTT: aPTT: 29 seconds (ref 24–37)

## 2010-12-07 SURGERY — LAPAROSCOPY OPERATIVE
Anesthesia: General | Site: Abdomen | Wound class: Clean Contaminated

## 2010-12-07 MED ORDER — OXYCODONE-ACETAMINOPHEN 5-325 MG PO TABS: 1.0000 | ORAL_TABLET | ORAL | Status: DC | PRN

## 2010-12-07 MED ORDER — FENTANYL CITRATE 0.05 MG/ML IJ SOLN
INTRAMUSCULAR | Status: AC
  Filled 2010-12-07: qty 5

## 2010-12-07 MED ORDER — ONDANSETRON HCL 4 MG/2ML IJ SOLN: 4.0000 mg | Freq: Four times a day (QID) | INTRAMUSCULAR | Status: DC | PRN

## 2010-12-07 MED ORDER — GLYCOPYRROLATE 0.2 MG/ML IJ SOLN
INTRAMUSCULAR | Status: DC | PRN
  Administered 2010-12-07: 1 mg via INTRAVENOUS
  Administered 2010-12-07: 0.2 mg via INTRAVENOUS

## 2010-12-07 MED ORDER — PROMETHAZINE HCL 25 MG/ML IJ SOLN: 12.5000 mg | INTRAMUSCULAR | Status: DC | PRN

## 2010-12-07 MED ORDER — ALUM & MAG HYDROXIDE-SIMETH 200-200-20 MG/5ML PO SUSP: 30.0000 mL | ORAL | Status: DC | PRN

## 2010-12-07 MED ORDER — ROCURONIUM BROMIDE 50 MG/5ML IV SOLN
INTRAVENOUS | Status: AC
  Filled 2010-12-07: qty 1

## 2010-12-07 MED ORDER — MIDAZOLAM HCL 2 MG/2ML IJ SOLN
INTRAMUSCULAR | Status: AC
  Filled 2010-12-07: qty 2

## 2010-12-07 MED ORDER — BUPIVACAINE HCL (PF) 0.25 % IJ SOLN
INTRAMUSCULAR | Status: DC | PRN
  Administered 2010-12-07: 10 mL

## 2010-12-07 MED ORDER — CEFAZOLIN SODIUM 1-5 GM-% IV SOLN
INTRAVENOUS | Status: DC | PRN
  Administered 2010-12-07: 1 g via INTRAVENOUS

## 2010-12-07 MED ORDER — CEFAZOLIN SODIUM 1-5 GM-% IV SOLN
INTRAVENOUS | Status: AC
  Filled 2010-12-07: qty 50

## 2010-12-07 MED ORDER — LACTATED RINGERS IR SOLN
Status: DC | PRN
  Administered 2010-12-07: 3000 mL

## 2010-12-07 MED ORDER — PROPOFOL 10 MG/ML IV EMUL
INTRAVENOUS | Status: DC | PRN
  Administered 2010-12-07: 200 mg via INTRAVENOUS

## 2010-12-07 MED ORDER — ONDANSETRON HCL 4 MG/2ML IJ SOLN
INTRAMUSCULAR | Status: DC | PRN
  Administered 2010-12-07: 4 mg via INTRAVENOUS

## 2010-12-07 MED ORDER — BISACODYL 5 MG PO TBEC
5.0000 mg | DELAYED_RELEASE_TABLET | Freq: Every day | ORAL | Status: DC | PRN
  Filled 2010-12-07: qty 1

## 2010-12-07 MED ORDER — MORPHINE SULFATE (PF) 1 MG/ML IV SOLN
INTRAVENOUS | Status: DC
  Administered 2010-12-07: 3 mg via INTRAVENOUS
  Administered 2010-12-07: 7.5 mg via INTRAVENOUS
  Administered 2010-12-07: 25 mg via INTRAVENOUS
  Administered 2010-12-07: 6 mg via INTRAVENOUS
  Administered 2010-12-08: 25 mg via INTRAVENOUS
  Administered 2010-12-08: 3 mg via INTRAVENOUS
  Administered 2010-12-08: 16.18 mg via INTRAVENOUS
  Administered 2010-12-08: 11.77 mg via INTRAVENOUS
  Filled 2010-12-07 (×2): qty 25

## 2010-12-07 MED ORDER — MIDAZOLAM HCL 5 MG/5ML IJ SOLN
INTRAMUSCULAR | Status: DC | PRN
  Administered 2010-12-07: 2 mg via INTRAVENOUS

## 2010-12-07 MED ORDER — LACTATED RINGERS IV SOLN
INTRAVENOUS | Status: DC
  Administered 2010-12-07 – 2010-12-08 (×3): via INTRAVENOUS

## 2010-12-07 MED ORDER — INFLUENZA VIRUS VACC SPLIT PF IM SUSP
0.5000 mL | Freq: Once | INTRAMUSCULAR | Status: DC
  Filled 2010-12-07: qty 0.5

## 2010-12-07 MED ORDER — NEOSTIGMINE METHYLSULFATE 1 MG/ML IJ SOLN
INTRAMUSCULAR | Status: DC | PRN
  Administered 2010-12-07: 5 mg via INTRAVENOUS

## 2010-12-07 MED ORDER — MENTHOL 3 MG MT LOZG: 1.0000 | LOZENGE | OROMUCOSAL | Status: DC | PRN

## 2010-12-07 MED ORDER — ROCURONIUM BROMIDE 100 MG/10ML IV SOLN
INTRAVENOUS | Status: DC | PRN
  Administered 2010-12-07: 40 mg via INTRAVENOUS

## 2010-12-07 MED ORDER — FENTANYL CITRATE 0.05 MG/ML IJ SOLN
INTRAMUSCULAR | Status: DC | PRN
  Administered 2010-12-07 (×2): 50 ug via INTRAVENOUS
  Administered 2010-12-07 (×4): 100 ug via INTRAVENOUS

## 2010-12-07 MED ORDER — ONDANSETRON HCL 4 MG/2ML IJ SOLN
INTRAMUSCULAR | Status: AC
  Filled 2010-12-07: qty 2

## 2010-12-07 MED ORDER — PROPOFOL 10 MG/ML IV EMUL
INTRAVENOUS | Status: AC
  Filled 2010-12-07: qty 20

## 2010-12-07 MED ORDER — DIPHENHYDRAMINE HCL 12.5 MG/5ML PO ELIX
12.5000 mg | ORAL_SOLUTION | Freq: Four times a day (QID) | ORAL | Status: DC | PRN
  Filled 2010-12-07: qty 5

## 2010-12-07 MED ORDER — LACTATED RINGERS IV SOLN
INTRAVENOUS | Status: DC
  Administered 2010-12-07 (×2): via INTRAVENOUS

## 2010-12-07 MED ORDER — LIDOCAINE HCL (CARDIAC) 20 MG/ML IV SOLN
INTRAVENOUS | Status: DC | PRN
  Administered 2010-12-07: 40 mg via INTRAVENOUS

## 2010-12-07 MED ORDER — HEPARIN SODIUM (PORCINE) 5000 UNIT/ML IJ SOLN
INTRAMUSCULAR | Status: DC | PRN
  Administered 2010-12-07: 5000 [IU] via SUBCUTANEOUS

## 2010-12-07 MED ORDER — NEOSTIGMINE METHYLSULFATE 1 MG/ML IJ SOLN
INTRAMUSCULAR | Status: AC
  Filled 2010-12-07: qty 10

## 2010-12-07 MED ORDER — LIDOCAINE HCL (CARDIAC) 20 MG/ML IV SOLN
INTRAVENOUS | Status: AC
  Filled 2010-12-07: qty 5

## 2010-12-07 MED ORDER — DEXAMETHASONE SODIUM PHOSPHATE 10 MG/ML IJ SOLN
INTRAMUSCULAR | Status: AC
  Filled 2010-12-07: qty 1

## 2010-12-07 MED ORDER — DIPHENHYDRAMINE HCL 50 MG/ML IJ SOLN: 12.5000 mg | Freq: Four times a day (QID) | INTRAMUSCULAR | Status: DC | PRN

## 2010-12-07 MED ORDER — IBUPROFEN 600 MG PO TABS: 600.0000 mg | ORAL_TABLET | Freq: Four times a day (QID) | ORAL | Status: DC | PRN

## 2010-12-07 MED ORDER — SODIUM CHLORIDE 0.9 % IJ SOLN: 9.0000 mL | INTRAMUSCULAR | Status: DC | PRN

## 2010-12-07 MED ORDER — NALOXONE HCL 0.4 MG/ML IJ SOLN: 0.4000 mg | INTRAMUSCULAR | Status: DC | PRN

## 2010-12-07 MED ORDER — CEFAZOLIN SODIUM 1-5 GM-% IV SOLN: 1.0000 g | INTRAVENOUS | Status: DC

## 2010-12-07 MED ORDER — DEXAMETHASONE SODIUM PHOSPHATE 4 MG/ML IJ SOLN
INTRAMUSCULAR | Status: DC | PRN
  Administered 2010-12-07: 10 mg via INTRAVENOUS

## 2010-12-07 MED ORDER — GLYCOPYRROLATE 0.2 MG/ML IJ SOLN
INTRAMUSCULAR | Status: AC
  Filled 2010-12-07: qty 3

## 2010-12-07 SURGICAL SUPPLY — 22 items
BLADE SURG 15 STRL LF C SS BP (BLADE) ×2 IMPLANT
BLADE SURG 15 STRL SS (BLADE) ×1
CABLE HIGH FREQUENCY MONO STRZ (ELECTRODE) IMPLANT
CATH ROBINSON RED A/P 16FR (CATHETERS) ×3 IMPLANT
CLOTH BEACON ORANGE TIMEOUT ST (SAFETY) ×3 IMPLANT
DRAPE UTILITY XL STRL (DRAPES) IMPLANT
DRSG COVADERM PLUS 2X2 (GAUZE/BANDAGES/DRESSINGS) ×6 IMPLANT
FILTER SMOKE EVAC LAPAROSHD (FILTER) ×3 IMPLANT
FORCEPS CUTTING 33CM 5MM (CUTTING FORCEPS) ×3 IMPLANT
GLOVE BIO SURGEON STRL SZ7.5 (GLOVE) ×6 IMPLANT
GOWN PREVENTION PLUS LG XLONG (DISPOSABLE) ×6 IMPLANT
GOWN PREVENTION PLUS XLARGE (GOWN DISPOSABLE) ×3 IMPLANT
NS IRRIG 1000ML POUR BTL (IV SOLUTION) IMPLANT
PACK LAPAROSCOPY BASIN (CUSTOM PROCEDURE TRAY) ×3 IMPLANT
POUCH SPECIMEN RETRIEVAL 10MM (ENDOMECHANICALS) ×6 IMPLANT
RINGERS IRRIG 1000ML POUR BTL (IV SOLUTION) ×3 IMPLANT
SET IRRIG TUBING LAPAROSCOPIC (IRRIGATION / IRRIGATOR) ×3 IMPLANT
SOLUTION ELECTROLUBE (MISCELLANEOUS) ×3 IMPLANT
SUT VICRYL 4-0 PS2 18IN ABS (SUTURE) ×3 IMPLANT
TOWEL OR 17X24 6PK STRL BLUE (TOWEL DISPOSABLE) ×6 IMPLANT
TROCAR Z-THREAD BLADED 11X100M (TROCAR) ×3 IMPLANT
WATER STERILE IRR 1000ML POUR (IV SOLUTION) IMPLANT

## 2010-12-07 NOTE — Brief Op Note (Signed)
12/07/2010  8:54 AM  PATIENT:  Jasmin Hernandez  45 y.o. female  PRE-OPERATIVE DIAGNOSIS:  BILATERAL OVARIAN MASS  POST-OPERATIVE DIAGNOSIS:  BILATERAL OVARIAN MASS  PROCEDURE:  Procedure(s): LAPAROSCOPY OPERATIVE SALPINGO OOPHERECTOMY  SURGEON:  Surgeon(s): Miguel Aschoff W Lodema Hong  ANESTHESIA:   general  EBL:  Total I/O In: 1000 [I.V.:1000] Out: 75 [Blood:75]  BLOOD ADMINISTERED:none  DRAINS: none   LOCAL MEDICATIONS USED:  MARCAINE 10 CC  SPECIMEN:  Source of Specimen:  Right tube and ovary, left tube and ovary abdominal washings  DISPOSITION OF SPECIMEN:  PATHOLOGY  COUNTS:  YES  TOURNIQUET:  * No tourniquets in log *  DICTATION: .Other Dictation: Dictation Number [id number]  PLAN OF CARE: Admit for overnight observation  PATIENT DISPOSITION:  PACU - hemodynamically stable.   Delay start of Pharmacological VTE agent (>24hrs) due to surgical blood loss or risk of bleeding:  not applicable

## 2010-12-07 NOTE — Progress Notes (Signed)
  Physical exam and reason for procedure are unchanged since H&P oh 12/06/2010  BSO is expected to resolve issue.

## 2010-12-07 NOTE — Op Note (Signed)
Jasmin Hernandez, Jasmin Hernandez NO.:  [id number]  MEDICAL RECORD NO.:  [id number]  LOCATION:  9310                          FACILITY:  [facility]  PHYSICIAN:  Miguel Aschoff, M.D.       DATE OF BIRTH:  January 01, 1966  DATE OF PROCEDURE:  12/07/2010 DATE OF DISCHARGE:                              OPERATIVE REPORT   PREOPERATIVE DIAGNOSES: 1. Right adnexal mass. 2. History of breast cancer.  POSTOPERATIVE DIAGNOSES: 1. Right adnexal mass. 2. History of breast cancer.  PROCEDURES:  Diagnostic laparoscopy with bilateral salpingo oophorectomy and abdominal washings.  SURGEON:  Miguel Aschoff, MD.  ASSISTANT:  Dr. Duard Brady.  ANESTHESIA:  General.  COMPLICATIONS:  None.  JUSTIFICATION:  The patient is a 45 year old black female with history of breast cancer, found to have a right adnexal mass.  CA-125 levels had been obtained and were normal.  The mass has been observed with serial ultrasounds and has not resolved.  Because of this, she presents now to undergo laparoscopy, and salpingo-oophorectomy due to the fact that the patient has breast cancer on tamoxifen.  She also is to undergo bilateral salpingo-oophorectomy to try to eliminate her source of estrogen production.  The risks and benefits of procedure were discussed with the patient.  Informed consent has been obtained.  The patient understands that this procedure will in essence place her in surgical menopause.  PROCEDURE IN DETAIL:  The patient was taken to the operating room, placed in supine position, and general anesthesia was administered without difficulty.  She was then placed in modified lithotomy position. Prepped and draped in usual sterile fashion.  Bladder was catheterized. After this was done, a speculum was placed in the vaginal vault. Anterior cervical lip was grasped with a tenaculum and then a Hulka tenaculum was placed through the cervix for manipulation.  Attention was then directed to the umbilicus  where a small infraumbilical incision was made.  A Veress needle was then inserted and the abdomen was insufflated with 3 L of CO2.  Following the insufflation, the trocar to laparoscope was placed followed by laparoscope itself.  Then under direct visualization, a 12-mm port was established in the left lower quadrant and a 5-mm port was established in the right lower quadrant.  Systematic inspection revealed the anterior bladder peritoneum to be unremarkable. The uterus appeared to be normal size and shape.  There were some small papillary clear a millimeter size lesions noted on the surface of the uterus.  These were not found in any other locations.  The left tube appeared to be within normal limits as was the left ovary.  The right ovary however had a large mass approximately 6-7 cm in size, multi lobulated.  There were no external excrescences noted.  Intestinal surfaces appeared to be within normal limits.  The appendix was visualized and within normal limits.  The liver surface was unremarkable.  At this point, using the gyrus unit, the right infundibulopelvic ligament was identified, elevated, ureter was identified and then the infundibulopelvic ligament was cauterized and cut.  Dissection continued along the mesovarian ligament and mesosalpinx until the coronal portion of the uterus was reached.  This was done with serial  cauterizations with the gyrus unit and cutting.  Then, the utero-ovarian ligament and residual portion of the fallopian tube were cauterized and transected freeing the specimen.  The specimen was then placed in the cul-de-sac. Attention was then directed to the left side where the identical procedure was carried out.  The infundibulopelvic ligament again was identified, elevated.  Ureter identified and then infundibulopelvic ligament cauterized, cut, and then dissection continued in similar fashion until it was possible to free the left tube and ovary.  At  this point, an EndoCatch system was placed into the abdomen, the large mass placed in this, and brought through the left lower quadrant incision.  The fascia was then incised for approximately 4 cm, brought through the incision, and then the large mass was debulked and brought out with care to avoid any contamination from the contents of the bag into the incision of the peritoneal cavity.  Once this ovary had been debulked and removed, the trocar was replaced into the left lower quadrant incision, a second EndoCatch bag placed, and then the left tube and ovary were brought out and sent as a separate specimen.  Once this was done, final inspection was made for hemostasis.  Hemostasis appeared to be excellent.  The pelvis irrigated with warm saline.  Lap counts, instrument counts were taken and found to be correct and then it was elected to close the abdomen.  The fascia involving the left lower quadrant incision was then identified and then closed using figure-of- eight sutures of 0 Vicryl.  The remaining 2 port sites were closed using subcutaneous 0 Vicryl followed by subcuticular 4-0 Vicryl.  The port sites were then injected with 0.25% Marcaine.  The patient reversed from the anesthetic and brought to the recovery room in satisfactory condition.  The plan is for the patient to be observed in an extended outpatient basis and if stable, to be sent home on December 08, 2010.  The patient tolerated the procedure well.  Blood loss was minimal.     Miguel Aschoff, M.D.     AR/MEDQ  D:  12/07/2010  T:  12/07/2010  Job:  [id number]  cc:   Wilmon Arms. Corliss Skains, M.D. [address] [place] [address] [id number] [place]

## 2010-12-07 NOTE — Anesthesia Postprocedure Evaluation (Signed)
  Anesthesia Post-op Note  Patient: Jasmin Hernandez  Procedure(s) Performed:  LAPAROSCOPY OPERATIVE; SALPINGO OOPHERECTOMY  Patient is awake and responsive. Pain and nausea are reasonably well controlled. Vital signs are stable and clinically acceptable. Oxygen saturation is clinically acceptable. There are no apparent anesthetic complications at this time. Patient is ready for discharge.

## 2010-12-07 NOTE — Transfer of Care (Signed)
Immediate Anesthesia Transfer of Care Note  Patient: Jasmin Hernandez  Procedure(s) Performed:  LAPAROSCOPY OPERATIVE; SALPINGO OOPHERECTOMY  Patient Location: PACU  Anesthesia Type: General  Level of Consciousness: awake, alert  and oriented  Airway & Oxygen Therapy: Patient Spontanous Breathing and Patient connected to nasal cannula oxygen  Post-op Assessment: Report given to PACU RN and Post -op Vital signs reviewed and stable  Post vital signs: stable  Complications: No apparent anesthesia complications

## 2010-12-07 NOTE — Plan of Care (Signed)
Problem: Phase I Progression Outcomes Goal: IS, TCDB as ordered Outcome: Progressing Patient as a IS of 1750

## 2010-12-07 NOTE — Anesthesia Preprocedure Evaluation (Addendum)
Anesthesia Evaluation  Name, MR# and DOB Patient awake  General Assessment Comment  Reviewed: Allergy & Precautions, H&P , NPO status , Patient's Chart, lab work & pertinent test results, reviewed documented beta blocker date and time   Airway Mallampati: III TM Distance: >3 FB Neck ROM: Full    Dental No notable dental hx. (+) Teeth Intact   Pulmonary    Pulmonary exam normal       Cardiovascular Regular Normal    Neuro/Psych PSYCHIATRIC DISORDERS Anxiety Depression Negative Neurological ROS     GI/Hepatic negative GI ROS Neg liver ROS    Endo/Other  Negative Endocrine ROS  Renal/GU negative Renal ROS  Genitourinary negative   Musculoskeletal negative musculoskeletal ROS (+)   Abdominal Normal abdominal exam  (+)   Peds  Hematology negative hematology ROS (+)   Anesthesia Other Findings   Reproductive/Obstetrics negative OB ROS Bilateral Ovarian Masses                          Anesthesia Physical Anesthesia Plan  ASA: II  Anesthesia Plan: General   Post-op Pain Management:    Induction: Intravenous  Airway Management Planned: Oral ETT  Additional Equipment:   Intra-op Plan:   Post-operative Plan: Extubation in OR  Informed Consent: I have reviewed the patients History and Physical, chart, labs and discussed the procedure including the risks, benefits and alternatives for the proposed anesthesia with the patient or authorized representative who has indicated his/her understanding and acceptance.   Dental advisory given  Plan Discussed with: CRNA and Anesthesiologist  Anesthesia Plan Comments:         Anesthesia Quick Evaluation

## 2010-12-07 NOTE — Anesthesia Procedure Notes (Signed)
Procedure Name: Intubation Date/Time: 12/07/2010 7:49 AM Performed by: Lincoln Brigham Pre-anesthesia Checklist: Patient identified, Patient being monitored, Emergency Drugs available, Timeout performed and Suction available Patient Re-evaluated:Patient Re-evaluated prior to inductionOxygen Delivery Method: Circle System Utilized Preoxygenation: Pre-oxygenation with 100% oxygen Intubation Type: IV induction Ventilation: Mask ventilation without difficulty Laryngoscope Size: Miller and 2 Grade View: Grade I Tube type: Oral Tube size: 7.0 mm Number of attempts: 1 Airway Equipment and Method: stylet Secured at: 23 cm

## 2010-12-08 NOTE — Discharge Summary (Addendum)
Patient in stable condition. Patient stated doctor had given her prescriptions and RN gave discharge instructions. Patient ambulated out of hospital accompanied by her husband and RN. Patient was discharged home. No equipment was prescribed.

## 2010-12-08 NOTE — Progress Notes (Signed)
  12/08/2010  S. Stable S/P laparoscopic BSO for large right ovarian mass. Has been stable all night. Pain under good control. Patient has tolerated a regular diet.  O. Afebrile V/S Stable.      Abdomen is soft wounds are clean and dry      Pathology and cytology are pending  A. Stable S/P bilateral salpingo oophorectomy  P. Discharge home. Return to office in 3 to 4 weeks.       Call for pathology report next week     Take pain medication as needed      Resume all prior pre op meds      Call for any problems such as fever severe pain or heavy bleeding      Nothing per vagina for 3 weeks      Regular diet        Condition improved       Percocet one every 3 to 4 hours as needed for pain

## 2010-12-10 ENCOUNTER — Ambulatory Visit (HOSPITAL_COMMUNITY): Admission: RE | Admit: 2010-12-10 | Payer: Medicare Other | Source: Ambulatory Visit | Admitting: Surgery

## 2010-12-10 ENCOUNTER — Encounter (HOSPITAL_COMMUNITY): Payer: Self-pay | Admitting: Obstetrics and Gynecology

## 2010-12-12 NOTE — Discharge Summary (Signed)
[name], Jasmin Hernandez NO.:  [id number]  MEDICAL RECORD NO.:  [id number]  LOCATION:  9310                          FACILITY:  [facility]  PHYSICIAN:  Miguel Aschoff, M.D.       DATE OF BIRTH:  1965-10-03  DATE OF ADMISSION:  12/07/2010 DATE OF DISCHARGE:  12/08/2010                              DISCHARGE SUMMARY   ADMISSION DIAGNOSES:  Persistent right adnexal mass, history of breast cancer on tamoxifen therapy.  OPERATIONS AND PROCEDURES:  Laparoscopy with bilateral salpingo- oophorectomy.  ANESTHESIA:  General anesthesia.  BRIEF HISTORY:  The patient is a 45 year old black female with a diagnosis of breast carcinoma who is on tamoxifen therapy in an effort to reduce endogenous estrogen in the patient's system due to the breast cancer.  The patient was noted to have a ovarian cyst that has persisted over time and is now reached approximately 6-7 cm in size.  The patient had a CA-125 level drawn which was within normal limits, but because of the mass, she presents now to undergo laparoscopy and possible laparotomy.  In addition, in view of the breast carcinoma, the patient will also undergo bilateral salpingo-oophorectomy even though the mass involves the right ovary.  HOSPITAL COURSE:  Preoperative studies were obtained.  This included admission hemoglobin of 13, hematocrit 37.5, white count was 7. Chemistry profile was essentially within normal limits.  PT and PTT were within normal limits.  Analysis was negative.  HOSPITAL COURSE:  Under general anesthesia, laparoscopy was carried out on December 07, 2010.  At the time of surgery, the patient again was found to have a large approximately 7-8 cm mass encompassing the right ovary.  The left ovary was essentially within normal limits.  The uterus was unremarkable.  However, there was some small vesicular lesions noted on the surface of the uterus.  In addition to the procedures, cytology was obtained which ultimately  returned as negative.  It was possible to perform the bilateral salpingo-oophorectomies without difficulty.  The patient was observed overnight and she remained in satisfactory condition and stable and by December 08, 2010, she was in satisfactory condition, able to be discharged home.  Medications for home included Tylox 1 every 3-4 h. as needed for pain.  She was instructed to do no heavy lifting, place nothing in the vagina and to call if there are any problems.  The patient will be seen back in 2-3 weeks for a follow up examination.  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  The fallopian tube showed no significant abnormalities.  The left ovary showed cystic follicles which were benign.  The fallopian tube on the left was also unremarkable.     Miguel Aschoff, M.D.    AR/MEDQ  D:  12/11/2010  T:  12/12/2010  Job:  [id number]

## 2010-12-24 ENCOUNTER — Other Ambulatory Visit (INDEPENDENT_AMBULATORY_CARE_PROVIDER_SITE_OTHER): Payer: Self-pay | Admitting: Surgery

## 2011-01-08 ENCOUNTER — Encounter (HOSPITAL_BASED_OUTPATIENT_CLINIC_OR_DEPARTMENT_OTHER): Payer: Self-pay | Admitting: *Deleted

## 2011-01-14 ENCOUNTER — Encounter (HOSPITAL_BASED_OUTPATIENT_CLINIC_OR_DEPARTMENT_OTHER)
Admission: RE | Admit: 2011-01-14 | Discharge: 2011-01-14 | Disposition: A | Discharge status: Home / Self Care | Payer: Medicare Other | Source: Ambulatory Visit | Attending: Surgery | Admitting: Surgery

## 2011-01-14 LAB — BASIC METABOLIC PANEL
Calcium: 9.3 mg/dL (ref 8.4–10.5)
Creatinine, Ser: 0.67 mg/dL (ref 0.50–1.10)
GFR calc non Af Amer: >90 mL/min (ref >90)
Glucose, Bld: 98 mg/dL (ref 70–99)
Sodium: 140 mEq/L (ref 135–145)

## 2011-01-16 ENCOUNTER — Encounter (HOSPITAL_BASED_OUTPATIENT_CLINIC_OR_DEPARTMENT_OTHER)
Admission: RE | Disposition: A | Discharge status: Home / Self Care | Payer: Self-pay | Source: Ambulatory Visit | Attending: Surgery

## 2011-01-16 ENCOUNTER — Other Ambulatory Visit (INDEPENDENT_AMBULATORY_CARE_PROVIDER_SITE_OTHER): Payer: Self-pay | Admitting: Surgery

## 2011-01-16 ENCOUNTER — Encounter (HOSPITAL_BASED_OUTPATIENT_CLINIC_OR_DEPARTMENT_OTHER): Payer: Self-pay | Admitting: *Deleted

## 2011-01-16 ENCOUNTER — Ambulatory Visit (HOSPITAL_BASED_OUTPATIENT_CLINIC_OR_DEPARTMENT_OTHER)
Admission: RE | Admit: 2011-01-16 | Discharge: 2011-01-16 | Disposition: A | Discharge status: Home / Self Care | Payer: Medicare Other | Source: Ambulatory Visit | Attending: Surgery | Admitting: Surgery

## 2011-01-16 ENCOUNTER — Ambulatory Visit (HOSPITAL_BASED_OUTPATIENT_CLINIC_OR_DEPARTMENT_OTHER): Payer: Medicare Other | Admitting: Anesthesiology

## 2011-01-16 ENCOUNTER — Encounter (HOSPITAL_BASED_OUTPATIENT_CLINIC_OR_DEPARTMENT_OTHER): Payer: Self-pay | Admitting: Anesthesiology

## 2011-01-16 DIAGNOSIS — D249 Benign neoplasm of unspecified breast: Secondary | ICD-10-CM

## 2011-01-16 DIAGNOSIS — Z01812 Encounter for preprocedural laboratory examination: Secondary | ICD-10-CM | POA: Insufficient documentation

## 2011-01-16 DIAGNOSIS — D1739 Benign lipomatous neoplasm of skin and subcutaneous tissue of other sites: Secondary | ICD-10-CM | POA: Insufficient documentation

## 2011-01-16 HISTORY — PX: BREAST BIOPSY: SHX20

## 2011-01-16 HISTORY — DX: Anxiety disorder, unspecified: F41.9

## 2011-01-16 SURGERY — BREAST BIOPSY
Anesthesia: General | Site: Breast | Laterality: Right | Wound class: Clean

## 2011-01-16 MED ORDER — CEFAZOLIN SODIUM 1-5 GM-% IV SOLN
INTRAVENOUS | Status: DC | PRN
  Administered 2011-01-16: 2 g via INTRAVENOUS

## 2011-01-16 MED ORDER — DEXAMETHASONE SODIUM PHOSPHATE 4 MG/ML IJ SOLN
INTRAMUSCULAR | Status: DC | PRN
  Administered 2011-01-16: 10 mg via INTRAVENOUS

## 2011-01-16 MED ORDER — CEFAZOLIN SODIUM 1-5 GM-% IV SOLN: 1.0000 g | INTRAVENOUS | Status: DC

## 2011-01-16 MED ORDER — BUPIVACAINE-EPINEPHRINE 0.25% -1:200000 IJ SOLN
INTRAMUSCULAR | Status: DC | PRN
  Administered 2011-01-16: 18 mL

## 2011-01-16 MED ORDER — LIDOCAINE HCL (CARDIAC) 20 MG/ML IV SOLN
INTRAVENOUS | Status: DC | PRN
  Administered 2011-01-16: 60 mg via INTRAVENOUS

## 2011-01-16 MED ORDER — DROPERIDOL 2.5 MG/ML IJ SOLN
INTRAMUSCULAR | Status: DC | PRN
  Administered 2011-01-16: 0.625 mg via INTRAVENOUS

## 2011-01-16 MED ORDER — OXYCODONE-ACETAMINOPHEN 5-325 MG PO TABS
1.0000 | ORAL_TABLET | ORAL | Status: AC | PRN
Start: 2011-01-16 — End: 2011-01-26

## 2011-01-16 MED ORDER — FENTANYL CITRATE 0.05 MG/ML IJ SOLN: 25.0000 ug | INTRAMUSCULAR | Status: DC | PRN

## 2011-01-16 MED ORDER — MIDAZOLAM HCL 5 MG/5ML IJ SOLN
INTRAMUSCULAR | Status: DC | PRN
  Administered 2011-01-16: 2 mg via INTRAVENOUS

## 2011-01-16 MED ORDER — LACTATED RINGERS IV SOLN
INTRAVENOUS | Status: DC
  Administered 2011-01-16 (×2): via INTRAVENOUS

## 2011-01-16 MED ORDER — FENTANYL CITRATE 0.05 MG/ML IJ SOLN
INTRAMUSCULAR | Status: DC | PRN
  Administered 2011-01-16: 100 ug via INTRAVENOUS
  Administered 2011-01-16 (×2): 50 ug via INTRAVENOUS

## 2011-01-16 MED ORDER — ONDANSETRON HCL 4 MG/2ML IJ SOLN
INTRAMUSCULAR | Status: DC | PRN
  Administered 2011-01-16: 4 mg via INTRAVENOUS

## 2011-01-16 MED ORDER — PROPOFOL 10 MG/ML IV EMUL
INTRAVENOUS | Status: DC | PRN
  Administered 2011-01-16: 200 mg via INTRAVENOUS

## 2011-01-16 MED ORDER — CEFAZOLIN SODIUM-DEXTROSE 2-3 GM-% IV SOLR: 2.0000 g | INTRAVENOUS | Status: DC

## 2011-01-16 MED ORDER — PROMETHAZINE HCL 25 MG/ML IJ SOLN: 6.2500 mg | INTRAMUSCULAR | Status: DC | PRN

## 2011-01-16 SURGICAL SUPPLY — 42 items
APPLICATOR COTTON TIP 6IN STRL (MISCELLANEOUS) IMPLANT
BENZOIN TINCTURE PRP APPL 2/3 (GAUZE/BANDAGES/DRESSINGS) ×2 IMPLANT
BLADE HEX COATED 2.75 (ELECTRODE) ×2 IMPLANT
BLADE SURG 15 STRL LF DISP TIS (BLADE) ×1 IMPLANT
BLADE SURG 15 STRL SS (BLADE) ×1
CANISTER SUCTION 1200CC (MISCELLANEOUS) IMPLANT
CHLORAPREP W/TINT 26ML (MISCELLANEOUS) ×2 IMPLANT
CLOTH BEACON ORANGE TIMEOUT ST (SAFETY) ×2 IMPLANT
COVER MAYO STAND STRL (DRAPES) ×2 IMPLANT
COVER TABLE BACK 60X90 (DRAPES) ×2 IMPLANT
DECANTER SPIKE VIAL GLASS SM (MISCELLANEOUS) ×2 IMPLANT
DEVICE DUBIN W/COMP PLATE 8390 (MISCELLANEOUS) IMPLANT
DRAPE PED LAPAROTOMY (DRAPES) ×2 IMPLANT
DRAPE UTILITY XL STRL (DRAPES) ×2 IMPLANT
DRSG TEGADERM 4X4.75 (GAUZE/BANDAGES/DRESSINGS) ×2 IMPLANT
ELECT REM PT RETURN 9FT ADLT (ELECTROSURGICAL) ×2
ELECTRODE REM PT RTRN 9FT ADLT (ELECTROSURGICAL) ×1 IMPLANT
GAUZE SPONGE 4X4 12PLY STRL LF (GAUZE/BANDAGES/DRESSINGS) ×2 IMPLANT
GLOVE BIO SURGEON STRL SZ7 (GLOVE) ×2 IMPLANT
GLOVE SKINSENSE NS SZ7.0 (GLOVE) ×1
GLOVE SKINSENSE STRL SZ7.0 (GLOVE) ×1 IMPLANT
GOWN PREVENTION PLUS XLARGE (GOWN DISPOSABLE) ×4 IMPLANT
KIT MARKER MARGIN INK (KITS) IMPLANT
NEEDLE HYPO 25X1 1.5 SAFETY (NEEDLE) ×2 IMPLANT
NS IRRIG 1000ML POUR BTL (IV SOLUTION) ×2 IMPLANT
PACK BASIN DAY SURGERY FS (CUSTOM PROCEDURE TRAY) ×2 IMPLANT
PENCIL BUTTON HOLSTER BLD 10FT (ELECTRODE) ×2 IMPLANT
SLEEVE SCD COMPRESS KNEE MED (MISCELLANEOUS) ×2 IMPLANT
SPONGE GAUZE 2X2 8PLY STRL LF (GAUZE/BANDAGES/DRESSINGS) ×2 IMPLANT
SPONGE LAP 4X18 X RAY DECT (DISPOSABLE) ×2 IMPLANT
STRIP CLOSURE SKIN 1/2X4 (GAUZE/BANDAGES/DRESSINGS) ×2 IMPLANT
SUT CHROMIC 3 0 SH 27 (SUTURE) IMPLANT
SUT MON AB 4-0 PC3 18 (SUTURE) ×2 IMPLANT
SUT SILK 2 0 SH (SUTURE) IMPLANT
SUT VIC AB 3-0 SH 27 (SUTURE) ×1
SUT VIC AB 3-0 SH 27X BRD (SUTURE) ×1 IMPLANT
SYR CONTROL 10ML LL (SYRINGE) ×2 IMPLANT
TOWEL OR 17X24 6PK STRL BLUE (TOWEL DISPOSABLE) ×4 IMPLANT
TOWEL OR NON WOVEN STRL DISP B (DISPOSABLE) ×2 IMPLANT
TUBE CONNECTING 20X1/4 (TUBING) IMPLANT
WATER STERILE IRR 1000ML POUR (IV SOLUTION) IMPLANT
YANKAUER SUCT BULB TIP NO VENT (SUCTIONS) IMPLANT
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
## 2011-01-16 NOTE — Anesthesia Postprocedure Evaluation (Signed)
  Anesthesia Post-op Note  Patient: Jasmin Hernandez  Procedure(s) Performed:  BREAST BIOPSY  Patient Location: PACU  Anesthesia Type: General  Level of Consciousness: awake and alert   Airway and Oxygen Therapy: Patient Spontanous Breathing  Post-op Pain: mild  Post-op Assessment: Post-op Vital signs reviewed, Patient's Cardiovascular Status Stable, Respiratory Function Stable, Patent Airway and No signs of Nausea or vomiting  Post-op Vital Signs: stable  Complications: No apparent anesthesia complications
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
## 2011-01-16 NOTE — Anesthesia Preprocedure Evaluation (Addendum)
Anesthesia Evaluation  Patient identified by MRN, date of birth, ID band Patient awake    Reviewed: Allergy & Precautions, H&P , NPO status , Patient's Chart, lab work & pertinent test results  Airway Mallampati: I TM Distance: >3 FB Neck ROM: Full    Dental   Pulmonary Current Smoker (hoarse),    Pulmonary exam normal       Cardiovascular hypertension,     Neuro/Psych PSYCHIATRIC DISORDERS Anxiety Depression    GI/Hepatic   Endo/Other    Renal/GU      Musculoskeletal   Abdominal   Peds  Hematology   Anesthesia Other Findings   Reproductive/Obstetrics                          Anesthesia Physical Anesthesia Plan  ASA: II  Anesthesia Plan: General   Post-op Pain Management:    Induction: Intravenous  Airway Management Planned: LMA  Additional Equipment:   Intra-op Plan:   Post-operative Plan: Extubation in OR  Informed Consent: I have reviewed the patients History and Physical, chart, labs and discussed the procedure including the risks, benefits and alternatives for the proposed anesthesia with the patient or authorized representative who has indicated his/her understanding and acceptance.     Plan Discussed with: CRNA and Surgeon  Anesthesia Plan Comments:         Anesthesia Quick Evaluation

## 2011-01-16 NOTE — Anesthesia Procedure Notes (Addendum)
Performed by: Signa Kell    Procedure Name: LMA Insertion Date/Time: 01/16/2011 1:07 PM Performed by: Signa Kell Pre-anesthesia Checklist: Patient identified, Emergency Drugs available, Suction available and Patient being monitored Patient Re-evaluated:Patient Re-evaluated prior to inductionOxygen Delivery Method: Circle System Utilized Preoxygenation: Pre-oxygenation with 100% oxygen Intubation Type: IV induction Ventilation: Mask ventilation without difficulty LMA: LMA inserted LMA Size: 4.0 Number of attempts: 1 Airway Equipment and Method: bite block Placement Confirmation: positive ETCO2 Tube secured with: Tape Dental Injury: Teeth and Oropharynx as per pre-operative assessment     Performed by: Signa Kell

## 2011-01-16 NOTE — Transfer of Care (Signed)
Immediate Anesthesia Transfer of Care Note  Patient: Jasmin Hernandez  Procedure(s) Performed:  BREAST BIOPSY  Patient Location: PACU  Anesthesia Type: General  Level of Consciousness: awake and patient cooperative  Airway & Oxygen Therapy: Patient Spontanous Breathing and Patient connected to face mask oxygen  Post-op Assessment: Report given to PACU RN and Post -op Vital signs reviewed and stable  Post vital signs: Reviewed and stable  Complications: No apparent anesthesia complications

## 2011-01-18 ENCOUNTER — Encounter (HOSPITAL_BASED_OUTPATIENT_CLINIC_OR_DEPARTMENT_OTHER): Payer: Self-pay | Admitting: Surgery

## 2011-01-18 NOTE — Progress Notes (Signed)
Quick Note:  Please call the patient and let them know that their path is normal. No sign of cancer. This was a lipoma, which is nothing to be concerned about. ______

## 2011-02-04 ENCOUNTER — Encounter (INDEPENDENT_AMBULATORY_CARE_PROVIDER_SITE_OTHER): Payer: Self-pay | Admitting: Surgery

## 2011-02-04 ENCOUNTER — Other Ambulatory Visit: Payer: Self-pay | Admitting: *Deleted

## 2011-02-04 ENCOUNTER — Ambulatory Visit (INDEPENDENT_AMBULATORY_CARE_PROVIDER_SITE_OTHER): Payer: Medicare Other | Admitting: Surgery

## 2011-02-04 ENCOUNTER — Other Ambulatory Visit: Payer: Self-pay | Admitting: Internal Medicine

## 2011-02-04 VITALS — BP 146/112 | HR 64 | Temp 97.8°F | Resp 18 | Ht 66.0 in | Wt 219.1 lb

## 2011-02-04 DIAGNOSIS — N63 Unspecified lump in unspecified breast: Secondary | ICD-10-CM

## 2011-02-04 DIAGNOSIS — C50919 Malignant neoplasm of unspecified site of unspecified female breast: Secondary | ICD-10-CM

## 2011-02-04 DIAGNOSIS — N6310 Unspecified lump in the right breast, unspecified quadrant: Secondary | ICD-10-CM

## 2011-02-04 NOTE — Progress Notes (Signed)
S/p right excisional breast biopsy.  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  The incision is well-healed with no sign of infection.  She may resume full activity.  Follow-up in 1 year.  Filed Vitals:   02/04/11 1356  BP: 146/112  Pulse: 64  Temp: 97.8 F (36.6 C)  Resp: 18   Damontay Alred K. Corliss Skains, MD, [facility]  02/04/2011 2:13 PM

## 2011-02-05 MED ORDER — TAMOXIFEN CITRATE 20 MG PO TABS: 20.0000 mg | ORAL_TABLET | Freq: Every day | ORAL | Status: DC

## 2011-03-01 ENCOUNTER — Other Ambulatory Visit: Payer: Self-pay | Admitting: *Deleted

## 2011-03-01 DIAGNOSIS — C50919 Malignant neoplasm of unspecified site of unspecified female breast: Secondary | ICD-10-CM

## 2011-03-01 MED ORDER — TAMOXIFEN CITRATE 20 MG PO TABS: 20.0000 mg | ORAL_TABLET | Freq: Every day | ORAL | Status: DC
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
## 2011-03-08 ENCOUNTER — Telehealth: Payer: Self-pay | Admitting: Oncology

## 2011-03-08 NOTE — Telephone Encounter (Signed)
Talked to pt, gave her appt date for 03/19/11 lab and MD on 03/25/11

## 2011-03-19 ENCOUNTER — Other Ambulatory Visit (HOSPITAL_BASED_OUTPATIENT_CLINIC_OR_DEPARTMENT_OTHER): Payer: Medicare Other | Admitting: Lab

## 2011-03-19 DIAGNOSIS — C50919 Malignant neoplasm of unspecified site of unspecified female breast: Secondary | ICD-10-CM

## 2011-03-19 LAB — COMPREHENSIVE METABOLIC PANEL
Albumin: 4.5 g/dL (ref 3.5–5.2)
CO2: 25 mEq/L (ref 19–32)
Glucose, Bld: 90 mg/dL (ref 70–99)
Sodium: 141 mEq/L (ref 135–145)
Total Bilirubin: 0.3 mg/dL (ref 0.3–1.2)
Total Protein: 7.8 g/dL (ref 6.0–8.3)

## 2011-03-19 LAB — CBC WITH DIFFERENTIAL/PLATELET
Basophils Absolute: 0.1 10*3/uL (ref 0.0–0.1)
Eosinophils Absolute: 0.1 10*3/uL (ref 0.0–0.5)
HCT: 41 % (ref 34.8–46.6)
HGB: 13.8 g/dL (ref 11.6–15.9)
LYMPH%: 43.3 % (ref 14.0–49.7)
MONO#: 0.2 10*3/uL (ref 0.1–0.9)
NEUT#: 3.6 10*3/uL (ref 1.5–6.5)
Platelets: 201 10*3/uL (ref 145–400)
RBC: 4.6 10*6/uL (ref 3.70–5.45)
WBC: 7.1 10*3/uL (ref 3.9–10.3)

## 2011-03-19 LAB — CANCER ANTIGEN 27.29: CA 27.29: 22 U/mL (ref 0–39)

## 2011-03-25 ENCOUNTER — Other Ambulatory Visit: Payer: Self-pay | Admitting: *Deleted

## 2011-03-25 ENCOUNTER — Ambulatory Visit (HOSPITAL_BASED_OUTPATIENT_CLINIC_OR_DEPARTMENT_OTHER): Payer: Medicare Other | Admitting: Oncology

## 2011-03-25 VITALS — BP 155/94 | HR 74 | Temp 98.5°F | Ht 65.0 in | Wt 225.5 lb

## 2011-03-25 DIAGNOSIS — Z170 Estrogen receptor positive status [ER+]: Secondary | ICD-10-CM

## 2011-03-25 DIAGNOSIS — Z79810 Long term (current) use of selective estrogen receptor modulators (SERMs): Secondary | ICD-10-CM

## 2011-03-25 DIAGNOSIS — C50919 Malignant neoplasm of unspecified site of unspecified female breast: Secondary | ICD-10-CM

## 2011-03-25 DIAGNOSIS — Z1231 Encounter for screening mammogram for malignant neoplasm of breast: Secondary | ICD-10-CM
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
## 2011-03-27 NOTE — Telephone Encounter (Signed)
error 

## 2011-05-29 ENCOUNTER — Telehealth: Payer: Self-pay | Admitting: *Deleted

## 2011-05-29 NOTE — Telephone Encounter (Signed)
Pt called to state onset x post 2 days of pain " around the surgical site ".  Madisson clarified discomfort is not on scar but " around it and tender to touch ".  She denies any redness,increased warmth, or swelling.  Return call number given as [phone].  This note will be reviewed with MD.

## 2011-09-27 ENCOUNTER — Ambulatory Visit: Payer: Medicare Other

## 2011-10-14 ENCOUNTER — Other Ambulatory Visit: Payer: Self-pay | Admitting: Oncology

## 2011-10-14 ENCOUNTER — Other Ambulatory Visit: Payer: Self-pay | Admitting: *Deleted

## 2011-10-14 MED ORDER — TAMOXIFEN CITRATE 20 MG PO TABS: 20.0000 mg | ORAL_TABLET | Freq: Every day | ORAL | Status: DC

## 2011-10-16 ENCOUNTER — Ambulatory Visit
Admission: RE | Admit: 2011-10-16 | Discharge: 2011-10-16 | Disposition: A | Discharge status: Home / Self Care | Payer: Medicare Other | Source: Ambulatory Visit | Attending: Oncology | Admitting: Oncology

## 2011-10-16 DIAGNOSIS — Z1231 Encounter for screening mammogram for malignant neoplasm of breast: Secondary | ICD-10-CM

## 2011-11-09 ENCOUNTER — Encounter (HOSPITAL_COMMUNITY): Payer: Self-pay | Admitting: *Deleted

## 2011-11-09 ENCOUNTER — Emergency Department (HOSPITAL_COMMUNITY)
Admission: EM | Admit: 2011-11-09 | Discharge: 2011-11-09 | Discharge status: Against Medical Advice | Payer: Medicare Other | Attending: Emergency Medicine | Admitting: Emergency Medicine

## 2011-11-09 DIAGNOSIS — F329 Major depressive disorder, single episode, unspecified: Secondary | ICD-10-CM | POA: Insufficient documentation

## 2011-11-09 DIAGNOSIS — Z8249 Family history of ischemic heart disease and other diseases of the circulatory system: Secondary | ICD-10-CM | POA: Insufficient documentation

## 2011-11-09 DIAGNOSIS — Z8041 Family history of malignant neoplasm of ovary: Secondary | ICD-10-CM | POA: Insufficient documentation

## 2011-11-09 DIAGNOSIS — Z6379 Other stressful life events affecting family and household: Secondary | ICD-10-CM | POA: Insufficient documentation

## 2011-11-09 DIAGNOSIS — Z888 Allergy status to other drugs, medicaments and biological substances status: Secondary | ICD-10-CM | POA: Insufficient documentation

## 2011-11-09 DIAGNOSIS — Z833 Family history of diabetes mellitus: Secondary | ICD-10-CM | POA: Insufficient documentation

## 2011-11-09 DIAGNOSIS — F411 Generalized anxiety disorder: Secondary | ICD-10-CM | POA: Insufficient documentation

## 2011-11-09 DIAGNOSIS — Z8049 Family history of malignant neoplasm of other genital organs: Secondary | ICD-10-CM | POA: Insufficient documentation

## 2011-11-09 DIAGNOSIS — F17200 Nicotine dependence, unspecified, uncomplicated: Secondary | ICD-10-CM | POA: Insufficient documentation

## 2011-11-09 DIAGNOSIS — Z809 Family history of malignant neoplasm, unspecified: Secondary | ICD-10-CM | POA: Insufficient documentation

## 2011-11-09 DIAGNOSIS — F3289 Other specified depressive episodes: Secondary | ICD-10-CM | POA: Insufficient documentation

## 2011-11-09 DIAGNOSIS — I10 Essential (primary) hypertension: Secondary | ICD-10-CM

## 2011-11-09 DIAGNOSIS — Z810 Family history of intellectual disabilities: Secondary | ICD-10-CM | POA: Insufficient documentation

## 2011-11-09 DIAGNOSIS — Z853 Personal history of malignant neoplasm of breast: Secondary | ICD-10-CM | POA: Insufficient documentation

## 2011-11-09 DIAGNOSIS — Z8261 Family history of arthritis: Secondary | ICD-10-CM | POA: Insufficient documentation

## 2011-11-09 DIAGNOSIS — L0390 Cellulitis, unspecified: Secondary | ICD-10-CM

## 2011-11-09 DIAGNOSIS — L02419 Cutaneous abscess of limb, unspecified: Secondary | ICD-10-CM | POA: Insufficient documentation

## 2011-11-09 MED ORDER — METOPROLOL TARTRATE 100 MG PO TABS: 50.0000 mg | ORAL_TABLET | Freq: Two times a day (BID) | ORAL | Status: DC

## 2011-11-09 MED ORDER — SULFAMETHOXAZOLE-TRIMETHOPRIM 800-160 MG PO TABS: 1.0000 | ORAL_TABLET | Freq: Two times a day (BID) | ORAL | Status: DC

## 2011-11-09 NOTE — ED Notes (Signed)
Patient reports onset of raised area on her right outer leg.  She states the area is hot and painful.  She is concerned also due to hx of cancer.

## 2011-11-09 NOTE — ED Provider Notes (Signed)
History     CSN: [id number]  Arrival date & time 11/09/11  4098   First MD Initiated Contact with Patient 11/09/11 [phone]      Chief Complaint  Patient presents with  . Insect Bite  . Hypertension    (Consider location/radiation/quality/duration/timing/severity/associated sxs/prior treatment) Patient is a 46 y.o. female presenting with hypertension. The history is provided by the patient.  Hypertension   sent here with red tender swollen area to right lower extremity which she believes is secondary to an insect bite. No other rashes on her body. Denies any fever or myalgias. Symptoms began this morning. No medications taken for this prior to arrival. Does have a history of hypertension and she has not taken her medications for one month. Denies any severe headaches, chest pain, abdominal pain  Past Medical History  Diagnosis Date  . Hypertension   . Cancer 10/2009    breast  . Depression     h/o bipolar- no meds now  . Anxiety     Past Surgical History  Procedure Date  . Mastectomy     left  . Breast surgery   . Tubal ligation   . Laparoscopy 12/07/2010    Procedure: LAPAROSCOPY OPERATIVE;  Surgeon: Miguel Aschoff;  Location: [facility];  Service: Gynecology;  Laterality: N/A;  . Salpingoophorectomy 12/07/2010    Procedure: SALPINGO OOPHERECTOMY;  Surgeon: Miguel Aschoff;  Location: [facility];  Service: Gynecology;  Laterality: Bilateral;  . Breast biopsy 01/16/2011    Procedure: BREAST BIOPSY;  Surgeon: Wilmon Arms. Corliss Skains, MD;  Location: [facility];  Service: General;  Laterality: Right;    Family History  Problem Relation Age of Onset  . Mental retardation Other   . Hypertension Other   . Drug abuse Other   . Diabetes Other   . Arthritis Other   . Alcohol abuse Other   . Cancer Other     ovarian and uterine  . Cancer Mother   . Cancer Sister     History  Substance Use Topics  . Smoking status: Current Some Day Smoker -- 0.5 packs/day    Types: Cigarettes  .  Smokeless tobacco: Former Neurosurgeon  . Alcohol Use: Yes     OCCASIONAL    OB History    Grav Para Term Preterm Abortions TAB SAB Ect Mult Living                  Review of Systems  All other systems reviewed and are negative.    Allergies  Hydrochlorothiazide  Home Medications   Current Outpatient Rx  Name Route Sig Dispense Refill  . BYSTOLIC 5 MG PO TABS  take 1 tablet by mouth once daily for blood pressure 30 tablet 6  . ETODOLAC 400 MG PO TABS Oral Take 400 mg by mouth 2 (two) times daily as needed. For foot pain     . TAMOXIFEN CITRATE 20 MG PO TABS  TAKE ONE TABLET BY MOUTH ONE TIME DAILY 30 tablet prn  . TAMOXIFEN CITRATE 20 MG PO TABS Oral Take 1 tablet (20 mg total) by mouth daily. 30 tablet 12    BP 181/115  Pulse 101  Temp 98.7 F (37.1 C) (Oral)  Resp 20  Ht 5\' 6"  (1.676 m)  Wt 220 lb (99.791 kg)  BMI 35.51 kg/m2  SpO2 96%  Physical Exam  Nursing note and vitals reviewed. Constitutional: She is oriented to person, place, and time. She appears well-developed and well-nourished.  Non-toxic appearance. No distress.  HENT:  Head: Normocephalic and atraumatic.  Eyes: Conjunctivae normal, EOM and lids are normal. Pupils are equal, round, and reactive to light.  Neck: Normal range of motion. Neck supple. No tracheal deviation present. No mass present.  Cardiovascular: Normal rate, regular rhythm and normal heart sounds.  Exam reveals no gallop.   No murmur heard. Pulmonary/Chest: Effort normal and breath sounds normal. No stridor. No respiratory distress. She has no decreased breath sounds. She has no wheezes. She has no rhonchi. She has no rales.  Abdominal: Soft. Normal appearance and bowel sounds are normal. She exhibits no distension. There is no tenderness. There is no rebound and no CVA tenderness.  Musculoskeletal: Normal range of motion. She exhibits no edema and no tenderness.       Quarter-sized erythematous area at her right lower extremity with some  surrounding erythema without fluctuance or crepitus.  Neurological: She is alert and oriented to person, place, and time. She has normal strength. No cranial nerve deficit or sensory deficit. GCS eye subscore is 4. GCS verbal subscore is 5. GCS motor subscore is 6.  Skin: Skin is warm and dry. Rash noted. No abrasion noted.  Psychiatric: She has a normal mood and affect. Her speech is normal and behavior is normal.    ED Course  Procedures (including critical care time)  Labs Reviewed - No data to display No results found.   No diagnosis found.    MDM  Patient may have an early abscess but it is not drainable at this time. She does have some surrounding cellulitis. She will placed on antibiotics and will return here tomorrow for recheck. I will also give the patient a prescription for a beta blocker for her high blood pressure        Toy Baker, MD 11/09/11 [phone]

## 2011-11-10 ENCOUNTER — Encounter (HOSPITAL_COMMUNITY): Payer: Self-pay | Admitting: Emergency Medicine

## 2011-11-10 ENCOUNTER — Emergency Department (HOSPITAL_COMMUNITY)
Admission: EM | Admit: 2011-11-10 | Discharge: 2011-11-10 | Disposition: A | Discharge status: Home / Self Care | Payer: Medicare Other | Attending: Emergency Medicine | Admitting: Emergency Medicine

## 2011-11-10 DIAGNOSIS — F17200 Nicotine dependence, unspecified, uncomplicated: Secondary | ICD-10-CM | POA: Insufficient documentation

## 2011-11-10 DIAGNOSIS — Z853 Personal history of malignant neoplasm of breast: Secondary | ICD-10-CM | POA: Insufficient documentation

## 2011-11-10 DIAGNOSIS — F319 Bipolar disorder, unspecified: Secondary | ICD-10-CM | POA: Insufficient documentation

## 2011-11-10 DIAGNOSIS — L03119 Cellulitis of unspecified part of limb: Secondary | ICD-10-CM | POA: Insufficient documentation

## 2011-11-10 DIAGNOSIS — L02419 Cutaneous abscess of limb, unspecified: Secondary | ICD-10-CM

## 2011-11-10 DIAGNOSIS — I10 Essential (primary) hypertension: Secondary | ICD-10-CM | POA: Insufficient documentation

## 2011-11-10 MED ORDER — HYDROCODONE-ACETAMINOPHEN 5-325 MG PO TABS
1.0000 | ORAL_TABLET | Freq: Once | ORAL | Status: AC
  Administered 2011-11-10: 1 via ORAL
  Filled 2011-11-10: qty 1

## 2011-11-10 MED ORDER — DIAZEPAM 5 MG PO TABS
10.0000 mg | ORAL_TABLET | Freq: Once | ORAL | Status: AC
  Administered 2011-11-10: 5 mg via ORAL
  Filled 2011-11-10: qty 1

## 2011-11-10 NOTE — ED Provider Notes (Signed)
History     CSN: [id number]  Arrival date & time 11/10/11  0710   First MD Initiated Contact with Patient 11/10/11 [phone]      Chief Complaint  Patient presents with  . Wound Check    (Consider location/radiation/quality/duration/timing/severity/associated sxs/prior treatment) HPI Comments: Pt bit by insect in home a few days ago, became more swollen, red and painful.  Seen yesterday and put on oral abx, told to return today for a recheck.  Since then, no fevers, redness has gotten better, but area of swelling and pain has expanded some and now has "come to a head" with a small blister formation at center of area that is most red.    The history is provided by the patient and the spouse.    Past Medical History  Diagnosis Date  . Hypertension   . Cancer 10/2009    breast  . Depression     h/o bipolar- no meds now  . Anxiety     Past Surgical History  Procedure Date  . Mastectomy     left  . Breast surgery   . Tubal ligation   . Laparoscopy 12/07/2010    Procedure: LAPAROSCOPY OPERATIVE;  Surgeon: Miguel Aschoff;  Location: [facility];  Service: Gynecology;  Laterality: N/A;  . Salpingoophorectomy 12/07/2010    Procedure: SALPINGO OOPHERECTOMY;  Surgeon: Miguel Aschoff;  Location: [facility];  Service: Gynecology;  Laterality: Bilateral;  . Breast biopsy 01/16/2011    Procedure: BREAST BIOPSY;  Surgeon: Wilmon Arms. Corliss Skains, MD;  Location: [facility];  Service: General;  Laterality: Right;    Family History  Problem Relation Age of Onset  . Mental retardation Other   . Hypertension Other   . Drug abuse Other   . Diabetes Other   . Arthritis Other   . Alcohol abuse Other   . Cancer Other     ovarian and uterine  . Cancer Mother   . Cancer Sister     History  Substance Use Topics  . Smoking status: Current Some Day Smoker -- 0.5 packs/day    Types: Cigarettes  . Smokeless tobacco: Former Neurosurgeon  . Alcohol Use: Yes     OCCASIONAL    OB History    Grav Para Term  Preterm Abortions TAB SAB Ect Mult Living                  Review of Systems  Constitutional: Negative for fever and chills.  Musculoskeletal: Positive for arthralgias.  Skin: Positive for color change and wound.  Neurological: Negative for weakness and numbness.    Allergies  Hydrochlorothiazide  Home Medications   Current Outpatient Rx  Name Route Sig Dispense Refill  . IBUPROFEN 200 MG PO TABS Oral Take 600 mg by mouth every 6 (six) hours as needed. pain    . METOPROLOL TARTRATE 100 MG PO TABS Oral Take 0.5 tablets (50 mg total) by mouth 2 (two) times daily. 60 tablet 0  . SULFAMETHOXAZOLE-TRIMETHOPRIM 800-160 MG PO TABS Oral Take 1 tablet by mouth 2 (two) times daily. 28 tablet 0  . TAMOXIFEN CITRATE 20 MG PO TABS  TAKE ONE TABLET BY MOUTH ONE TIME DAILY 30 tablet prn    BP 131/94  Temp 98.3 F (36.8 C) (Oral)  Resp 18  SpO2 96%  Physical Exam  Nursing note and vitals reviewed. Constitutional: She is oriented to person, place, and time. She appears well-developed and well-nourished.  Non-toxic appearance. She does not have a sickly appearance.  She does not appear ill. No distress.  Musculoskeletal: She exhibits tenderness. She exhibits no edema.       Legs: Neurological: She is alert and oriented to person, place, and time. She has normal strength. No sensory deficit.  Skin: Skin is warm and dry. No rash noted. No pallor.    ED Course  INCISION AND DRAINAGE Date/Time: 11/10/2011 9:43 AM Performed by: Lear Ng. Authorized by: Lear Ng Consent: Verbal consent obtained. Written consent not obtained. Risks and benefits: risks, benefits and alternatives were discussed Consent given by: patient Patient understanding: patient states understanding of the procedure being performed Patient consent: the patient's understanding of the procedure matches consent given Procedure consent: procedure consent matches procedure scheduled Site marked: the operative  site was marked Patient identity confirmed: verbally with patient Time out: Immediately prior to procedure a "time out" was called to verify the correct patient, procedure, equipment, support staff and site/side marked as required. Type: abscess Body area: lower extremity Anesthesia: local infiltration Local anesthetic: lidocaine 2% without epinephrine Anesthetic total: 1 ml Patient sedated: no Scalpel size: 11 Incision type: single straight Complexity: simple Drainage: purulent Drainage amount: moderate Wound treatment: wound left open Packing material: 1/4 in iodoform gauze Patient tolerance: Patient tolerated the procedure well with no immediate complications.   (including critical care time)   Labs Reviewed  WOUND CULTURE   No results found.   1. Abscess of lower leg       MDM  Bedside U/S shows small fluid pocket at area of "head".  See procedure note, abscess drained.  Culture sent.  Pt can follow up with PCP.  Small amount of packing placed.          Gavin Pound. Oletta Lamas, MD 11/10/11 [phone]

## 2011-11-10 NOTE — ED Notes (Signed)
Pt. Stated, I was here yesterday and told to come back today for a recheck. Rt. Lower leg a quarter area swollen and red.

## 2011-11-10 NOTE — Discharge Instructions (Signed)
Abscess An abscess (boil or furuncle) is an infected area that contains a collection of pus.  SYMPTOMS Signs and symptoms of an abscess include pain, tenderness, redness, or hardness. You may feel a moveable soft area under your skin. An abscess can occur anywhere in the body.  TREATMENT  A surgical cut (incision) may be made over your abscess to drain the pus. Gauze may be packed into the space or a drain may be looped through the abscess cavity (pocket). This provides a drain that will allow the cavity to heal from the inside outwards. The abscess may be painful for a few days, but should feel much better if it was drained.  Your abscess, if seen early, may not have localized and may not have been drained. If not, another appointment may be required if it does not get better on its own or with medications. HOME CARE INSTRUCTIONS   Only take over-the-counter or prescription medicines for pain, discomfort, or fever as directed by your caregiver.   Take your antibiotics as directed if they were prescribed. Finish them even if you start to feel better.   Keep the skin and clothes clean around your abscess.   If the abscess was drained, you will need to use gauze dressing to collect any draining pus. Dressings will typically need to be changed 3 or more times a day.   The infection may spread by skin contact with others. Avoid skin contact as much as possible.   Practice good hygiene. This includes regular hand washing, cover any draining skin lesions, and do not share personal care items.   If you participate in sports, do not share athletic equipment, towels, whirlpools, or personal care items. Shower after every practice or tournament.   If a draining area cannot be adequately covered:   Do not participate in sports.   Children should not participate in day care until the wound has healed or drainage stops.   If your caregiver has given you a follow-up appointment, it is very important  to keep that appointment. Not keeping the appointment could result in a much worse infection, chronic or permanent injury, pain, and disability. If there is any problem keeping the appointment, you must call back to this facility for assistance.  SEEK MEDICAL CARE IF:   You develop increased pain, swelling, redness, drainage, or bleeding in the wound site.   You develop signs of generalized infection including muscle aches, chills, fever, or a general ill feeling.   You have an oral temperature above 102 F (38.9 C).  MAKE SURE YOU:   Understand these instructions.   Will watch your condition.   Will get help right away if you are not doing well or get worse.  Document Released: 11/14/2004 Document Revised: 01/24/2011 Document Reviewed: 09/08/2007 Exit[employer] Patient Information 2012 [employer].Abscess Care After An abscess (also called a boil or furuncle) is an infected area that contains a collection of pus. Signs and symptoms of an abscess include pain, tenderness, redness, or hardness, or you may feel a moveable soft area under your skin. An abscess can occur anywhere in the body. The infection may spread to surrounding tissues causing cellulitis. A cut (incision) by the surgeon was made over your abscess and the pus was drained out. Gauze may have been packed into the space to provide a drain that will allow the cavity to heal from the inside outwards. The boil may be painful for 5 to 7 days. Most people with a boil   do not have high fevers. Your abscess, if seen early, may not have localized, and may not have been lanced. If not, another appointment may be required for this if it does not get better on its own or with medications. HOME CARE INSTRUCTIONS   Only take over-the-counter or prescription medicines for pain, discomfort, or fever as directed by your caregiver.   When you bathe, soak and then remove gauze or iodoform packs at least daily or as directed by your caregiver. You may  then wash the wound gently with mild soapy water. Repack with gauze or do as your caregiver directs.  SEEK IMMEDIATE MEDICAL CARE IF:   You develop increased pain, swelling, redness, drainage, or bleeding in the wound site.   You develop signs of generalized infection including muscle aches, chills, fever, or a general ill feeling.   An oral temperature above 102 F (38.9 C) develops, not controlled by medication.  See your caregiver for a recheck if you develop any of the symptoms described above. If medications (antibiotics) were prescribed, take them as directed. Document Released: 08/23/2004 Document Revised: 01/24/2011 Document Reviewed: 04/20/2007 Exit[employer] Patient Information 2012 [employer]. 

## 2011-11-12 LAB — WOUND CULTURE
Gram Stain: NONE SEEN
Special Requests: NORMAL

## 2011-11-13 ENCOUNTER — Telehealth (HOSPITAL_COMMUNITY): Payer: Self-pay | Admitting: Emergency Medicine

## 2011-11-13 NOTE — ED Notes (Signed)
Results received from [facility]. (+) Wound Cx -> Few Staph Aureus.  Rx given in ED for Sulfa-Trimeth.  -> sensitive to the same.  Chart appended per protocol.

## 2011-12-15 ENCOUNTER — Emergency Department (HOSPITAL_COMMUNITY)
Admission: EM | Admit: 2011-12-15 | Discharge: 2011-12-15 | Disposition: A | Discharge status: Home / Self Care | Payer: Medicare Other | Attending: Emergency Medicine | Admitting: Emergency Medicine

## 2011-12-15 ENCOUNTER — Encounter (HOSPITAL_COMMUNITY): Payer: Self-pay | Admitting: Emergency Medicine

## 2011-12-15 DIAGNOSIS — L0292 Furuncle, unspecified: Secondary | ICD-10-CM

## 2011-12-15 DIAGNOSIS — F17200 Nicotine dependence, unspecified, uncomplicated: Secondary | ICD-10-CM | POA: Insufficient documentation

## 2011-12-15 DIAGNOSIS — I10 Essential (primary) hypertension: Secondary | ICD-10-CM | POA: Insufficient documentation

## 2011-12-15 DIAGNOSIS — F411 Generalized anxiety disorder: Secondary | ICD-10-CM | POA: Insufficient documentation

## 2011-12-15 DIAGNOSIS — F3289 Other specified depressive episodes: Secondary | ICD-10-CM | POA: Insufficient documentation

## 2011-12-15 DIAGNOSIS — Z853 Personal history of malignant neoplasm of breast: Secondary | ICD-10-CM | POA: Insufficient documentation

## 2011-12-15 DIAGNOSIS — L02429 Furuncle of limb, unspecified: Secondary | ICD-10-CM | POA: Insufficient documentation

## 2011-12-15 DIAGNOSIS — F329 Major depressive disorder, single episode, unspecified: Secondary | ICD-10-CM | POA: Insufficient documentation

## 2011-12-15 DIAGNOSIS — Z79899 Other long term (current) drug therapy: Secondary | ICD-10-CM | POA: Insufficient documentation

## 2011-12-15 MED ORDER — NAPROXEN 500 MG PO TABS: 500.0000 mg | ORAL_TABLET | Freq: Two times a day (BID) | ORAL | Status: DC

## 2011-12-15 MED ORDER — SULFAMETHOXAZOLE-TRIMETHOPRIM 800-160 MG PO TABS: 1.0000 | ORAL_TABLET | Freq: Two times a day (BID) | ORAL | Status: DC

## 2011-12-15 MED ORDER — SULFAMETHOXAZOLE-TMP DS 800-160 MG PO TABS
1.0000 | ORAL_TABLET | Freq: Once | ORAL | Status: AC
  Administered 2011-12-15: 1 via ORAL
  Filled 2011-12-15: qty 1

## 2011-12-15 NOTE — ED Notes (Signed)
Patient states she developed a sore on her right calf on Friday.  Patient was recently seen at [facility] for treatment of an abscess right below the current.  Patient states she is on Tamoxifen for breast CA (left breast) and her MD's want her to watch for blood clots in her extremities.  Patient denies fevers.

## 2011-12-15 NOTE — ED Provider Notes (Signed)
History     CSN: [id number] Arrival date & time 12/15/11  1539 First MD Initiated Contact with Patient 12/15/11 1632     Chief Complaint  Patient presents with  . Recurrent Skin Infections    sore noted on patient's right calf   HPI Patient presents to the emergency room with lengths of a small skin lesion on his right calf.  She noticed this yesterday. The symptoms progressed and this morning she developed a larger area of swelling so she came in for evaluation.  She has noticed some mild erythema and tenderness around the area. She has not had any swelling to her leg. Denies any fevers chills coughing vomiting or diarrhea. Patient previously had an episode of this before. She was treated with Bactrim and resolved.  Past Medical History  Diagnosis Date  . Hypertension   . Cancer 10/2009    breast  . Depression     h/o bipolar- no meds now  . Anxiety     Past Surgical History  Procedure Date  . Mastectomy     left  . Breast surgery   . Tubal ligation   . Laparoscopy 12/07/2010    Procedure: LAPAROSCOPY OPERATIVE;  Surgeon: Miguel Aschoff;  Location: [facility];  Service: Gynecology;  Laterality: N/A;  . Salpingoophorectomy 12/07/2010    Procedure: SALPINGO OOPHERECTOMY;  Surgeon: Miguel Aschoff;  Location: [facility];  Service: Gynecology;  Laterality: Bilateral;  . Breast biopsy 01/16/2011    Procedure: BREAST BIOPSY;  Surgeon: Wilmon Arms. Corliss Skains, MD;  Location: [facility];  Service: General;  Laterality: Right;    Family History  Problem Relation Age of Onset  . Mental retardation Other   . Hypertension Other   . Drug abuse Other   . Diabetes Other   . Arthritis Other   . Alcohol abuse Other   . Cancer Other     ovarian and uterine  . Cancer Mother   . Cancer Sister     History  Substance Use Topics  . Smoking status: Current Some Day Smoker -- 0.5 packs/day    Types: Cigarettes  . Smokeless tobacco: Former Neurosurgeon  . Alcohol Use: Yes     OCCASIONAL    OB  History    Grav Para Term Preterm Abortions TAB SAB Ect Mult Living                  Review of Systems  All other systems reviewed and are negative.    Allergies  Hydrochlorothiazide  Home Medications   Current Outpatient Rx  Name Route Sig Dispense Refill  . IBUPROFEN 200 MG PO TABS Oral Take 600 mg by mouth every 6 (six) hours as needed. pain    . METOPROLOL TARTRATE 50 MG PO TABS Oral Take 25 mg by mouth 2 (two) times daily.    Marland Kitchen TAMOXIFEN CITRATE 20 MG PO TABS Oral Take 20 mg by mouth daily.      BP 141/91  Pulse 95  Temp 98.6 F (37 C) (Oral)  Resp 20  Ht 5\' 6"  (1.676 m)  Wt 225 lb (102.059 kg)  BMI 36.32 kg/m2  SpO2 99%  Physical Exam  Nursing note and vitals reviewed. Constitutional: She appears well-developed and well-nourished. No distress.  HENT:  Head: Normocephalic and atraumatic.  Right Ear: External ear normal.  Left Ear: External ear normal.  Eyes: Conjunctivae normal are normal. Right eye exhibits no discharge. Left eye exhibits no discharge. No scleral icterus.  Neck: Neck supple.  No tracheal deviation present.  Cardiovascular: Normal rate and regular rhythm.  Exam reveals no gallop.   No murmur heard. Pulmonary/Chest: Effort normal and breath sounds normal. No stridor. No respiratory distress.  Musculoskeletal: She exhibits tenderness. She exhibits no edema.       1 cm raised pustule on the lateral aspect of the right calf, a few centimeters of surrounding erythema, no lymphangitic streaking, no calf tenderness or edema, distal neurovascular intact  Neurological: She is alert. Cranial nerve deficit: no gross deficits.  Skin: Skin is warm and dry. No rash noted.  Psychiatric: She has a normal mood and affect.    ED Course  INCISION AND DRAINAGE Performed by: Linwood Dibbles R Authorized by: Linwood Dibbles R Consent: Verbal consent obtained. Type: abscess Body area: lower extremity Location details: left leg Anesthesia: local infiltration Local  anesthetic: lidocaine 1% with epinephrine Anesthetic total: 2 ml Patient sedated: no Scalpel size: 11 Incision type: single straight Complexity: simple Drainage amount: scant Wound treatment: wound left open Patient tolerance: Patient tolerated the procedure well with no immediate complications.   (including critical care time)   Labs Reviewed  WOUND CULTURE   No results found.   No diagnosis found.    MDM  Considering the fact that the patient has had recurrent episodes and she currently takes tamoxifen a wound culture was sent for analysis. Patient previously was prescribed Septra with improvement. Unfortunately seems primarily to be a furuncle associated with an infected hair follicle. After unroofing the pustule there was hair within the lesion. Patient has been encouraged to continue warm compresses. I will prescribe Septra to cover for MRSA        Celene Kras, MD 12/15/11 1706

## 2011-12-15 NOTE — ED Notes (Addendum)
I & D completed of the postule on R calf. Dressing C/D/I.  Pt tolerated well. Denies any pain at the site at this time. Po abx given prior to discharge. Pt verbalized understanding of how to care for site and medication regimen. Husband at the bedside and teach back method used and successful.

## 2011-12-15 NOTE — ED Notes (Signed)
Pt states "she is undergoing breast ca treatment. However, these nodules have been popping up on her legs. She first thought when she had the first one last month that maybe she was bitten by something, but now another one in a different location has appeared". She states "it is tender to touch ad painful at times. Site is reddened around the postule, postule is the size of a dime and fluid/puss filled. Calf tender to touch. Pt denies running any fevers or injury to the site.

## 2011-12-18 LAB — WOUND CULTURE

## 2011-12-19 NOTE — ED Notes (Signed)
+   wound Patient treated with septra. I/D done

## 2011-12-27 ENCOUNTER — Encounter: Payer: Self-pay | Admitting: Internal Medicine

## 2011-12-27 ENCOUNTER — Ambulatory Visit (INDEPENDENT_AMBULATORY_CARE_PROVIDER_SITE_OTHER): Payer: Medicare Other | Admitting: Internal Medicine

## 2011-12-27 VITALS — BP 128/80 | HR 74 | Temp 97.8°F | Resp 16 | Ht 66.0 in | Wt 241.0 lb

## 2011-12-27 DIAGNOSIS — A4902 Methicillin resistant Staphylococcus aureus infection, unspecified site: Secondary | ICD-10-CM

## 2011-12-27 DIAGNOSIS — L0291 Cutaneous abscess, unspecified: Secondary | ICD-10-CM

## 2011-12-27 DIAGNOSIS — B9562 Methicillin resistant Staphylococcus aureus infection as the cause of diseases classified elsewhere: Secondary | ICD-10-CM | POA: Insufficient documentation

## 2011-12-27 DIAGNOSIS — Z23 Encounter for immunization: Secondary | ICD-10-CM

## 2011-12-27 DIAGNOSIS — I10 Essential (primary) hypertension: Secondary | ICD-10-CM

## 2011-12-27 MED ORDER — SULFAMETHOXAZOLE-TMP DS 800-160 MG PO TABS: 1.0000 | ORAL_TABLET | Freq: Two times a day (BID) | ORAL | Status: DC

## 2011-12-27 MED ORDER — METOPROLOL TARTRATE 50 MG PO TABS: 25.0000 mg | ORAL_TABLET | Freq: Two times a day (BID) | ORAL | Status: DC

## 2011-12-27 MED ORDER — RIFAMPIN 300 MG PO CAPS: 300.0000 mg | ORAL_CAPSULE | Freq: Two times a day (BID) | ORAL | Status: DC

## 2011-12-27 NOTE — Progress Notes (Signed)
  Subjective:    Patient ID: Jasmin Hernandez, female    DOB: 09/22/1965, 46 y.o.   MRN: [id number]  Wound Check She was originally treated 10 to 14 days ago. Previous treatment included I&D of abscess. Her temperature was unmeasured prior to arrival. There has been no drainage from the wound. The redness has improved. The swelling has improved. The pain has improved. She has no difficulty moving the affected extremity or digit.      Review of Systems  Constitutional: Negative for fever, chills, diaphoresis, activity change, appetite change, fatigue and unexpected weight change.  HENT: Negative.   Eyes: Negative.   Respiratory: Negative for cough, chest tightness, shortness of breath, wheezing and stridor.   Cardiovascular: Negative for chest pain, palpitations and leg swelling.  Gastrointestinal: Negative.   Genitourinary: Negative.   Musculoskeletal: Negative.   Skin: Positive for wound. Negative for color change, pallor and rash.  Neurological: Negative.   Hematological: Negative for adenopathy. Does not bruise/bleed easily.  Psychiatric/Behavioral: Negative.        Objective:   Physical Exam  Vitals reviewed. Constitutional: She is oriented to person, place, and time. She appears well-developed and well-nourished. No distress.  HENT:  Head: Normocephalic and atraumatic.  Mouth/Throat: Oropharynx is clear and moist. No oropharyngeal exudate.  Eyes: Conjunctivae normal are normal. Right eye exhibits no discharge. Left eye exhibits no discharge. No scleral icterus.  Neck: Normal range of motion. Neck supple. No JVD present. No tracheal deviation present. No thyromegaly present.  Cardiovascular: Normal rate, regular rhythm, normal heart sounds and intact distal pulses.  Exam reveals no gallop and no friction rub.   No murmur heard. Pulmonary/Chest: Effort normal and breath sounds normal. No stridor. No respiratory distress. She has no wheezes. She has no rales. She exhibits no  tenderness.  Abdominal: Soft. Bowel sounds are normal. She exhibits no distension and no mass. There is no tenderness. There is no rebound and no guarding.  Musculoskeletal: Normal range of motion. She exhibits no edema and no tenderness.       Right lower leg: She exhibits deformity. She exhibits no tenderness, no bony tenderness, no swelling, no edema and no laceration.       Legs:      Over her right lateral calf there are 2 scabbed areas and 2 area with excoriations and erythema, there is no ttp/induration/warmth/fluctuance/streaking  Lymphadenopathy:    She has no cervical adenopathy.  Neurological: She is oriented to person, place, and time.  Skin: Skin is warm and dry. No rash noted. She is not diaphoretic. No erythema. No pallor.  Psychiatric: She has a normal mood and affect. Her behavior is normal. Judgment and thought content normal.      Lab Results  Component Value Date   WBC 7.1 03/19/2011   HGB 13.8 03/19/2011   HCT 41.0 03/19/2011   PLT 201 03/19/2011   GLUCOSE 90 03/19/2011   ALT 15 03/19/2011   AST 18 03/19/2011   NA 141 03/19/2011   K 3.9 03/19/2011   CL 104 03/19/2011   CREATININE 0.84 03/19/2011   BUN 13 03/19/2011   CO2 25 03/19/2011   TSH 1.17 10/04/2009   INR 0.94 12/07/2010   HGBA1C 5.7 10/04/2009     Assessment & Plan:

## 2011-12-27 NOTE — Patient Instructions (Signed)
Cellulitis Cellulitis is an infection of the skin and the tissue beneath it. The infected area is usually red and tender. Cellulitis occurs most often in the arms and lower legs.   CAUSES   Cellulitis is caused by bacteria that enter the skin through cracks or cuts in the skin. The most common types of bacteria that cause cellulitis are Staphylococcus and Streptococcus. SYMPTOMS    Redness and warmth.   Swelling.   Tenderness or pain.   Fever.  DIAGNOSIS  Your caregiver can usually determine what is wrong based on a physical exam. Blood tests may also be done. TREATMENT   Treatment usually involves taking an antibiotic medicine. HOME CARE INSTRUCTIONS    Take your antibiotics as directed. Finish them even if you start to feel better.   Keep the infected arm or leg elevated to reduce swelling.   Apply a warm cloth to the affected area up to 4 times per day to relieve pain.   Only take over-the-counter or prescription medicines for pain, discomfort, or fever as directed by your caregiver.   Keep all follow-up appointments as directed by your caregiver.  SEEK MEDICAL CARE IF:    You notice red streaks coming from the infected area.   Your red area gets larger or turns dark in color.   Your bone or joint underneath the infected area becomes painful after the skin has healed.   Your infection returns in the same area or another area.   You notice a swollen bump in the infected area.   You develop new symptoms.  SEEK IMMEDIATE MEDICAL CARE IF:    You have a fever.   You feel very sleepy.   You develop vomiting or diarrhea.   You have a general ill feeling (malaise) with muscle aches and pains.  MAKE SURE YOU:    Understand these instructions.   Will watch your condition.   Will get help right away if you are not doing well or get worse.  Document Released: 11/14/2004 Document Revised: 08/06/2011 Document Reviewed: 04/22/2011 [employer] Patient Information 2013  [employer]    

## 2011-12-27 NOTE — Assessment & Plan Note (Signed)
Her BP is well controlled 

## 2011-12-27 NOTE — Assessment & Plan Note (Signed)
I have asked her to take 10 day course of SMX-TMP and rifampin to treat the current infection and reservoirs, she has family members that have had abscess as well and she will get them treated as well, I see no areas that could be drained today

## 2012-01-08 ENCOUNTER — Ambulatory Visit: Payer: Medicare Other | Admitting: Internal Medicine

## 2012-03-24 ENCOUNTER — Telehealth: Payer: Self-pay | Admitting: Oncology

## 2012-03-24 ENCOUNTER — Other Ambulatory Visit (HOSPITAL_BASED_OUTPATIENT_CLINIC_OR_DEPARTMENT_OTHER): Payer: Medicare Other | Admitting: Lab

## 2012-03-24 ENCOUNTER — Encounter: Payer: Self-pay | Admitting: Physician Assistant

## 2012-03-24 ENCOUNTER — Ambulatory Visit (HOSPITAL_BASED_OUTPATIENT_CLINIC_OR_DEPARTMENT_OTHER): Payer: Medicaid Other | Admitting: Physician Assistant

## 2012-03-24 VITALS — BP 122/86 | HR 81 | Temp 98.2°F | Resp 20 | Ht 66.0 in | Wt 239.0 lb

## 2012-03-24 DIAGNOSIS — C50419 Malignant neoplasm of upper-outer quadrant of unspecified female breast: Secondary | ICD-10-CM

## 2012-03-24 DIAGNOSIS — Z1231 Encounter for screening mammogram for malignant neoplasm of breast: Secondary | ICD-10-CM

## 2012-03-24 DIAGNOSIS — C50919 Malignant neoplasm of unspecified site of unspecified female breast: Secondary | ICD-10-CM

## 2012-03-24 DIAGNOSIS — Z170 Estrogen receptor positive status [ER+]: Secondary | ICD-10-CM

## 2012-03-24 DIAGNOSIS — Z9010 Acquired absence of unspecified breast and nipple: Secondary | ICD-10-CM

## 2012-03-24 LAB — COMPREHENSIVE METABOLIC PANEL (CC13)
ALT: 17 U/L (ref 0–55)
AST: 15 U/L (ref 5–34)
Albumin: 3.4 g/dL — ABNORMAL LOW (ref 3.5–5.0)
Calcium: 9.3 mg/dL (ref 8.4–10.4)
Chloride: 104 mEq/L (ref 98–107)
Creatinine: 0.8 mg/dL (ref 0.6–1.1)
Potassium: 3.8 mEq/L (ref 3.5–5.1)
Sodium: 140 mEq/L (ref 136–145)
Total Protein: 7.5 g/dL (ref 6.4–8.3)

## 2012-03-24 LAB — CBC WITH DIFFERENTIAL/PLATELET
BASO%: 0.6 % (ref 0.0–2.0)
EOS%: 0.8 % (ref 0.0–7.0)
MCH: 29.7 pg (ref 25.1–34.0)
MCHC: 34.6 g/dL (ref 31.5–36.0)
RBC: 4.55 10*6/uL (ref 3.70–5.45)
RDW: 12.6 % (ref 11.2–14.5)
WBC: 6.2 10*3/uL (ref 3.9–10.3)
lymph#: 2.9 10*3/uL (ref 0.9–3.3)

## 2012-03-24 MED ORDER — TAMOXIFEN CITRATE 20 MG PO TABS: 20.0000 mg | ORAL_TABLET | Freq: Every day | ORAL | Status: DC
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
## 2012-03-24 NOTE — Telephone Encounter (Signed)
gv pt appt schedule for January/Febraury 2015 and mammo for 8/29 @ [facility].

## 2012-10-16 ENCOUNTER — Ambulatory Visit
Admission: RE | Admit: 2012-10-16 | Discharge: 2012-10-16 | Disposition: A | Discharge status: Home / Self Care | Payer: Medicare Other | Source: Ambulatory Visit | Attending: Physician Assistant | Admitting: Physician Assistant

## 2012-10-16 DIAGNOSIS — Z1231 Encounter for screening mammogram for malignant neoplasm of breast: Secondary | ICD-10-CM

## 2012-12-17 ENCOUNTER — Ambulatory Visit: Payer: Medicare Other | Admitting: Internal Medicine

## 2012-12-21 ENCOUNTER — Encounter: Payer: Self-pay | Admitting: Internal Medicine

## 2012-12-21 ENCOUNTER — Other Ambulatory Visit (INDEPENDENT_AMBULATORY_CARE_PROVIDER_SITE_OTHER): Payer: Medicare Other

## 2012-12-21 ENCOUNTER — Emergency Department (HOSPITAL_COMMUNITY)
Admission: EM | Admit: 2012-12-21 | Discharge: 2012-12-21 | Discharge status: Against Medical Advice | Payer: Medicare Other | Attending: Emergency Medicine | Admitting: Emergency Medicine

## 2012-12-21 ENCOUNTER — Encounter (HOSPITAL_COMMUNITY): Payer: Self-pay | Admitting: Emergency Medicine

## 2012-12-21 ENCOUNTER — Ambulatory Visit (INDEPENDENT_AMBULATORY_CARE_PROVIDER_SITE_OTHER): Payer: Medicare Other | Admitting: Internal Medicine

## 2012-12-21 VITALS — BP 114/80 | HR 87 | Temp 98.3°F | Resp 16 | Ht 66.0 in | Wt 243.0 lb

## 2012-12-21 DIAGNOSIS — G4700 Insomnia, unspecified: Secondary | ICD-10-CM

## 2012-12-21 DIAGNOSIS — I10 Essential (primary) hypertension: Secondary | ICD-10-CM | POA: Insufficient documentation

## 2012-12-21 DIAGNOSIS — R7309 Other abnormal glucose: Secondary | ICD-10-CM

## 2012-12-21 DIAGNOSIS — R200 Anesthesia of skin: Secondary | ICD-10-CM

## 2012-12-21 DIAGNOSIS — Z0000 Encounter for general adult medical examination without abnormal findings: Secondary | ICD-10-CM

## 2012-12-21 DIAGNOSIS — Z8659 Personal history of other mental and behavioral disorders: Secondary | ICD-10-CM | POA: Insufficient documentation

## 2012-12-21 DIAGNOSIS — F17200 Nicotine dependence, unspecified, uncomplicated: Secondary | ICD-10-CM | POA: Insufficient documentation

## 2012-12-21 DIAGNOSIS — Z79899 Other long term (current) drug therapy: Secondary | ICD-10-CM | POA: Insufficient documentation

## 2012-12-21 DIAGNOSIS — R739 Hyperglycemia, unspecified: Secondary | ICD-10-CM | POA: Insufficient documentation

## 2012-12-21 DIAGNOSIS — Z853 Personal history of malignant neoplasm of breast: Secondary | ICD-10-CM | POA: Insufficient documentation

## 2012-12-21 DIAGNOSIS — R209 Unspecified disturbances of skin sensation: Secondary | ICD-10-CM | POA: Insufficient documentation

## 2012-12-21 LAB — LDL CHOLESTEROL, DIRECT: Direct LDL: 95.9 mg/dL

## 2012-12-21 LAB — CBC WITH DIFFERENTIAL/PLATELET
Basophils Absolute: 0 10*3/uL (ref 0.0–0.1)
Basophils Relative: 0.5 % (ref 0.0–3.0)
Eosinophils Absolute: 0.1 10*3/uL (ref 0.0–0.7)
Hemoglobin: 13.7 g/dL (ref 12.0–15.0)
MCHC: 34.1 g/dL (ref 30.0–36.0)
MCV: 86.6 fl (ref 78.0–100.0)
Monocytes Absolute: 0.3 10*3/uL (ref 0.1–1.0)
Neutro Abs: 2.3 10*3/uL (ref 1.4–7.7)
Neutrophils Relative %: 39.1 % — ABNORMAL LOW (ref 43.0–77.0)
RBC: 4.64 Mil/uL (ref 3.87–5.11)

## 2012-12-21 LAB — URINALYSIS, ROUTINE W REFLEX MICROSCOPIC
Bilirubin Urine: NEGATIVE
Hgb urine dipstick: NEGATIVE
Ketones, ur: NEGATIVE
Nitrite: NEGATIVE
Total Protein, Urine: NEGATIVE
Urine Glucose: NEGATIVE
Urobilinogen, UA: 0.2 (ref 0.0–1.0)
pH: 7 (ref 5.0–8.0)

## 2012-12-21 LAB — COMPREHENSIVE METABOLIC PANEL
AST: 22 U/L (ref 0–37)
Albumin: 3.9 g/dL (ref 3.5–5.2)
Alkaline Phosphatase: 61 U/L (ref 39–117)
BUN: 11 mg/dL (ref 6–23)
Creatinine, Ser: 0.7 mg/dL (ref 0.4–1.2)
Potassium: 3.9 mEq/L (ref 3.5–5.1)
Total Protein: 7.8 g/dL (ref 6.0–8.3)

## 2012-12-21 LAB — LIPID PANEL: VLDL: 49.2 mg/dL — ABNORMAL HIGH (ref 0.0–40.0)

## 2012-12-21 MED ORDER — METOPROLOL TARTRATE 50 MG PO TABS: 25.0000 mg | ORAL_TABLET | Freq: Two times a day (BID) | ORAL | Status: DC

## 2012-12-21 MED ORDER — TRAZODONE HCL 100 MG PO TABS: 100.0000 mg | ORAL_TABLET | Freq: Every day | ORAL | Status: DC

## 2012-12-21 NOTE — ED Notes (Signed)
Pt not in room, [place] left

## 2012-12-21 NOTE — Assessment & Plan Note (Signed)
Exam done Vaccines were reviewed and updated Labs ordered Pt ed material was given 

## 2012-12-21 NOTE — ED Notes (Signed)
Pt c/o tingling to rt leg x2days, states takes hypertension meds that was told risk for blood clots

## 2012-12-21 NOTE — ED Provider Notes (Signed)
CSN: [id number]     Arrival date & time 12/21/12  1421 History  This chart was scribed for Junious Silk, PA, working with Toy Baker, MD, by [facility] ED Scribe. This patient was seen in room WTR7/WTR7 and the patient's care was started at 3:30 PM.   Chief Complaint  Patient presents with  . Tingling    The history is provided by the patient. No language interpreter was used.    HPI Comments: Jasmin Hernandez is a 47 y.o. female with a history of HTN who presents to the Emergency Department complaining of intermittent episodes of tingling in her right leg for the past 2 days. She also describes a "throbbing' sensation to her right leg, but does not characterize this as pain. She states that her current episode of tingling has been present for 4 hours. She expresses concern that she is on a medication that she has been told elevates blood clot risks. She states that nothing she does causes episodes of this tingling to onset or subside. She denies recent long distance travel, or other long periods of immobilization. She states that she has no history of blood clots in her legs or lungs. She denies leg swelling, SOB, calf pain or any other symptoms. She is a current some day smoker of 0.5 packs/day.   Past Medical History  Diagnosis Date  . Hypertension   . Cancer 10/2009    breast  . Depression     h/o bipolar- no meds now  . Anxiety    Past Surgical History  Procedure Laterality Date  . Mastectomy      left  . Breast surgery    . Tubal ligation    . Laparoscopy  12/07/2010    Procedure: LAPAROSCOPY OPERATIVE;  Surgeon: Miguel Aschoff;  Location: [facility];  Service: Gynecology;  Laterality: N/A;  . Salpingoophorectomy  12/07/2010    Procedure: SALPINGO OOPHERECTOMY;  Surgeon: Miguel Aschoff;  Location: [facility];  Service: Gynecology;  Laterality: Bilateral;  . Breast biopsy  01/16/2011    Procedure: BREAST BIOPSY;  Surgeon: Wilmon Arms. Corliss Skains, MD;  Location: [facility];   Service: General;  Laterality: Right;   Family History  Problem Relation Age of Onset  . Mental retardation Other   . Hypertension Other   . Drug abuse Other   . Diabetes Other   . Arthritis Other   . Alcohol abuse Other   . Cancer Other     ovarian and uterine  . Cancer Mother   . Cancer Sister    History  Substance Use Topics  . Smoking status: Current Some Day Smoker -- 0.50 packs/day    Types: Cigarettes  . Smokeless tobacco: Former Neurosurgeon  . Alcohol Use: No     Comment: OCCASIONAL   OB History   Grav Para Term Preterm Abortions TAB SAB Ect Mult Living                 Review of Systems  Respiratory: Negative for shortness of breath.   Cardiovascular: Negative for leg swelling.  Musculoskeletal:       Intermittent throbbing and tingling in left leg.  All other systems reviewed and are negative.   Allergies  Hydrochlorothiazide  Home Medications   Current Outpatient Rx  Name  Route  Sig  Dispense  Refill  . metoprolol (LOPRESSOR) 50 MG tablet   Oral   Take 0.5 tablets (25 mg total) by mouth 2 (two) times daily.   60  tablet   11   . tamoxifen (NOLVADEX) 20 MG tablet   Oral   Take 1 tablet (20 mg total) by mouth daily.   30 tablet   11    Triage Vitals: BP 159/99  Pulse 82  Temp(Src) 98.3 F (36.8 C)  Resp 14  Ht 5\' 5"  (1.651 m)  Wt 243 lb (110.224 kg)  BMI 40.44 kg/m2  SpO2 97%  LMP 08/08/2010  Physical Exam  Nursing note and vitals reviewed. Constitutional: She is oriented to person, place, and time. She appears well-developed and well-nourished. No distress.  HENT:  Head: Normocephalic and atraumatic.  Right Ear: External ear normal.  Left Ear: External ear normal.  Nose: Nose normal.  Mouth/Throat: Oropharynx is clear and moist.  Eyes: Conjunctivae are normal.  Neck: Normal range of motion.  Cardiovascular: Normal rate, regular rhythm and normal heart sounds.   Pulmonary/Chest: Effort normal and breath sounds normal. No stridor. No  respiratory distress. She has no wheezes. She has no rales.  Abdominal: Soft. She exhibits no distension.  Musculoskeletal: Normal range of motion. She exhibits tenderness.  Tender to palpation in proximal right calf.  Neurological: She is alert and oriented to person, place, and time. She has normal strength.  Skin: Skin is warm and dry. She is not diaphoretic. No erythema.  Psychiatric: She has a normal mood and affect. Her behavior is normal.    ED Course  Procedures (including critical care time)  DIAGNOSTIC STUDIES: Oxygen Saturation is 97% on RA, normal by my interpretation.    COORDINATION OF CARE: 3:36 PM- Discussed plan to obtain an [place] to rule out a blood clot. Pt advised of plan for treatment and pt agrees.  Labs Review Labs Reviewed - No data to display Imaging Review No results found.  EKG Interpretation   None       MDM  No diagnosis found.  Patient eloped from ED. At that time a venous duplex was pending to rule out DVT as she is at increased risk due to cancer. Was unable to discuss risks of leaving with patient. No SOB, leg swelling, long trips.    I personally performed the services described in this documentation, which was scribed in my presence. The recorded information has been reviewed and is accurate.     Mora Bellman, PA-C 12/22/12 [phone]

## 2012-12-21 NOTE — Assessment & Plan Note (Signed)
I will check her A1C to see if she has developed DM2 

## 2012-12-21 NOTE — Progress Notes (Signed)
  Subjective:    Patient ID: Jasmin Hernandez, female    DOB: [birth year], 47 y.o.   MRN: [id number]  Hypertension This is a chronic problem. The current episode started more than 1 year ago. The problem has been gradually improving since onset. The problem is controlled. Pertinent negatives include no anxiety, blurred vision, chest pain, headaches, malaise/fatigue, neck pain, orthopnea, palpitations, peripheral edema, PND, shortness of breath or sweats. There are no associated agents to hypertension. Past treatments include beta blockers. The current treatment provides significant improvement. Compliance problems include exercise and diet.       Review of Systems  Constitutional: Positive for unexpected weight change (weight gain). Negative for fever, chills, malaise/fatigue, diaphoresis, activity change, appetite change and fatigue.  HENT: Negative.   Eyes: Negative.  Negative for blurred vision.  Respiratory: Negative.  Negative for apnea, cough, choking, chest tightness, shortness of breath, wheezing and stridor.   Cardiovascular: Negative.  Negative for chest pain, palpitations, orthopnea, leg swelling and PND.  Gastrointestinal: Negative.  Negative for nausea, vomiting, abdominal pain, diarrhea, constipation and blood in stool.  Endocrine: Negative.   Genitourinary: Negative.   Musculoskeletal: Negative.  Negative for arthralgias, back pain, gait problem, joint swelling, myalgias, neck pain and neck stiffness.  Skin: Negative.   Neurological: Negative.  Negative for dizziness and headaches.  Hematological: Negative.   Psychiatric/Behavioral: Positive for sleep disturbance (DFA and FA's). Negative for suicidal ideas, hallucinations, behavioral problems, confusion, self-injury, dysphoric mood, decreased concentration and agitation. The patient is not nervous/anxious and is not hyperactive.        Objective:   Physical Exam  Vitals reviewed. Constitutional: She is oriented to person,  place, and time. She appears well-developed and well-nourished. No distress.  HENT:  Head: Normocephalic and atraumatic.  Mouth/Throat: Oropharynx is clear and moist. No oropharyngeal exudate.  Eyes: Conjunctivae are normal. Right eye exhibits no discharge. Left eye exhibits no discharge. No scleral icterus.  Neck: Normal range of motion. Neck supple. No JVD present. No tracheal deviation present. No thyromegaly present.  Cardiovascular: Normal rate, regular rhythm, normal heart sounds and intact distal pulses.  Exam reveals no gallop and no friction rub.   No murmur heard. Pulmonary/Chest: Effort normal and breath sounds normal. No stridor. No respiratory distress. She has no wheezes. She has no rales. She exhibits no tenderness.  Abdominal: Soft. Bowel sounds are normal. She exhibits no distension and no mass. There is no tenderness. There is no rebound and no guarding.  Musculoskeletal: Normal range of motion. She exhibits no edema and no tenderness.  Lymphadenopathy:    She has no cervical adenopathy.  Neurological: She is oriented to person, place, and time.  Skin: Skin is warm and dry. No rash noted. She is not diaphoretic. No erythema. No pallor.  Psychiatric: She has a normal mood and affect. Her behavior is normal. Judgment and thought content normal.     Lab Results  Component Value Date   WBC 6.2 03/24/2012   HGB 13.5 03/24/2012   HCT 39.0 03/24/2012   PLT 198 03/24/2012   GLUCOSE 106* 03/24/2012   ALT 17 03/24/2012   AST 15 03/24/2012   NA 140 03/24/2012   K 3.8 03/24/2012   CL 104 03/24/2012   CREATININE 0.8 03/24/2012   BUN 10.6 03/24/2012   CO2 24 03/24/2012   TSH 1.17 10/04/2009   INR 0.94 12/07/2010   HGBA1C 5.7 10/04/2009       Assessment & Plan:

## 2012-12-21 NOTE — Assessment & Plan Note (Signed)
Her BP is well controlled Today I will check her labs to look for secondary causes of HTN and end organ damage

## 2012-12-21 NOTE — Assessment & Plan Note (Signed)
She will try trazodone for insomnia

## 2012-12-21 NOTE — Patient Instructions (Signed)
Hypertension As your heart beats, it forces blood through your arteries. This force is your blood pressure. If the pressure is too high, it is called hypertension (HTN) or high blood pressure. HTN is dangerous because you may have it and not know it. High blood pressure may mean that your heart has to work harder to pump blood. Your arteries may be narrow or stiff. The extra work puts you at risk for heart disease, stroke, and other problems.  Blood pressure consists of two numbers, a higher number over a lower, 110/72, for example. It is stated as "110 over 72." The ideal is below 120 for the top number (systolic) and under 80 for the bottom (diastolic). Write down your blood pressure today. You should pay close attention to your blood pressure if you have certain conditions such as:  Heart failure.  Prior heart attack.  Diabetes  Chronic kidney disease.  Prior stroke.  Multiple risk factors for heart disease. To see if you have HTN, your blood pressure should be measured while you are seated with your arm held at the level of the heart. It should be measured at least twice. A one-time elevated blood pressure reading (especially in the Emergency Department) does not mean that you need treatment. There may be conditions in which the blood pressure is different between your right and left arms. It is important to see your caregiver soon for a recheck. Most people have essential hypertension which means that there is not a specific cause. This type of high blood pressure may be lowered by changing lifestyle factors such as:  Stress.  Smoking.  Lack of exercise.  Excessive weight.  Drug/tobacco/alcohol use.  Eating less salt. Most people do not have symptoms from high blood pressure until it has caused damage to the body. Effective treatment can often prevent, delay or reduce that damage. TREATMENT  When a cause has been identified, treatment for high blood pressure is directed at the  cause. There are a large number of medications to treat HTN. These fall into several categories, and your caregiver will help you select the medicines that are best for you. Medications may have side effects. You should review side effects with your caregiver. If your blood pressure stays high after you have made lifestyle changes or started on medicines,   Your medication(s) may need to be changed.  Other problems may need to be addressed.  Be certain you understand your prescriptions, and know how and when to take your medicine.  Be sure to follow up with your caregiver within the time frame advised (usually within two weeks) to have your blood pressure rechecked and to review your medications.  If you are taking more than one medicine to lower your blood pressure, make sure you know how and at what times they should be taken. Taking two medicines at the same time can result in blood pressure that is too low. SEEK IMMEDIATE MEDICAL CARE IF:  You develop a severe headache, blurred or changing vision, or confusion.  You have unusual weakness or numbness, or a faint feeling.  You have severe chest or abdominal pain, vomiting, or breathing problems. MAKE SURE YOU:   Understand these instructions.  Will watch your condition.  Will get help right away if you are not doing well or get worse. Document Released: 02/04/2005 Document Revised: 04/29/2011 Document Reviewed: 09/25/2007 [employer] Patient Information 2014 [employer]. Preventive Care for Adults, Female A healthy lifestyle and preventive care can promote health and wellness.   Preventive health guidelines for women include the following key practices.  A routine yearly physical is a good way to check with your caregiver about your health and preventive screening. It is a chance to share any concerns and updates on your health, and to receive a thorough exam.  Visit your dentist for a routine exam and preventive care every 6  months. Brush your teeth twice a day and floss once a day. Good oral hygiene prevents tooth decay and gum disease.  The frequency of eye exams is based on your age, health, family medical history, use of contact lenses, and other factors. Follow your caregiver's recommendations for frequency of eye exams.  Eat a healthy diet. Foods like vegetables, fruits, whole grains, low-fat dairy products, and lean protein foods contain the nutrients you need without too many calories. Decrease your intake of foods high in solid fats, added sugars, and salt. Eat the right amount of calories for you.Get information about a proper diet from your caregiver, if necessary.  Regular physical exercise is one of the most important things you can do for your health. Most adults should get at least 150 minutes of moderate-intensity exercise (any activity that increases your heart rate and causes you to sweat) each week. In addition, most adults need muscle-strengthening exercises on 2 or more days a week.  Maintain a healthy weight. The body mass index (BMI) is a screening tool to identify possible weight problems. It provides an estimate of body fat based on height and weight. Your caregiver can help determine your BMI, and can help you achieve or maintain a healthy weight.For adults 20 years and older:  A BMI below 18.5 is considered underweight.  A BMI of 18.5 to 24.9 is normal.  A BMI of 25 to 29.9 is considered overweight.  A BMI of 30 and above is considered obese.  Maintain normal blood lipids and cholesterol levels by exercising and minimizing your intake of saturated fat. Eat a balanced diet with plenty of fruit and vegetables. Blood tests for lipids and cholesterol should begin at age 20 and be repeated every 5 years. If your lipid or cholesterol levels are high, you are over 50, or you are at high risk for heart disease, you may need your cholesterol levels checked more frequently.Ongoing high lipid and  cholesterol levels should be treated with medicines if diet and exercise are not effective.  If you smoke, find out from your caregiver how to quit. If you do not use tobacco, do not start.  If you are pregnant, do not drink alcohol. If you are breastfeeding, be very cautious about drinking alcohol. If you are not pregnant and choose to drink alcohol, do not exceed 1 drink per day. One drink is considered to be 12 ounces (355 mL) of beer, 5 ounces (148 mL) of wine, or 1.5 ounces (44 mL) of liquor.  Avoid use of street drugs. Do not share needles with anyone. Ask for help if you need support or instructions about stopping the use of drugs.  High blood pressure causes heart disease and increases the risk of stroke. Your blood pressure should be checked at least every 1 to 2 years. Ongoing high blood pressure should be treated with medicines if weight loss and exercise are not effective.  If you are 55 to 45 years old, ask your caregiver if you should take aspirin to prevent strokes.  Diabetes screening involves taking a blood sample to check your fasting blood sugar level. This should   be done once every 3 years, after age 45, if you are within normal weight and without risk factors for diabetes. Testing should be considered at a younger age or be carried out more frequently if you are overweight and have at least 1 risk factor for diabetes.  Breast cancer screening is essential preventive care for women. You should practice "breast self-awareness." This means understanding the normal appearance and feel of your breasts and may include breast self-examination. Any changes detected, no matter how small, should be reported to a caregiver. Women in their 20s and 30s should have a clinical breast exam (CBE) by a caregiver as part of a regular health exam every 1 to 3 years. After age 40, women should have a CBE every year. Starting at age 40, women should consider having a mammography (breast X-ray test)  every year. Women who have a family history of breast cancer should talk to their caregiver about genetic screening. Women at a high risk of breast cancer should talk to their caregivers about having magnetic resonance imaging (MRI) and a mammography every year.  The Pap test is a screening test for cervical cancer. A Pap test can show cell changes on the cervix that might become cervical cancer if left untreated. A Pap test is a procedure in which cells are obtained and examined from the lower end of the uterus (cervix).  Women should have a Pap test starting at age 21.  Between ages 21 and 29, Pap tests should be repeated every 2 years.  Beginning at age 30, you should have a Pap test every 3 years as long as the past 3 Pap tests have been normal.  Some women have medical problems that increase the chance of getting cervical cancer. Talk to your caregiver about these problems. It is especially important to talk to your caregiver if a new problem develops soon after your last Pap test. In these cases, your caregiver may recommend more frequent screening and Pap tests.  The above recommendations are the same for women who have or have not gotten the vaccine for human papillomavirus (HPV).  If you had a hysterectomy for a problem that was not cancer or a condition that could lead to cancer, then you no longer need Pap tests. Even if you no longer need a Pap test, a regular exam is a good idea to make sure no other problems are starting.  If you are between ages 65 and 70, and you have had normal Pap tests going back 10 years, you no longer need Pap tests. Even if you no longer need a Pap test, a regular exam is a good idea to make sure no other problems are starting.  If you have had past treatment for cervical cancer or a condition that could lead to cancer, you need Pap tests and screening for cancer for at least 20 years after your treatment.  If Pap tests have been discontinued, risk factors  (such as a new sexual partner) need to be reassessed to determine if screening should be resumed.  The HPV test is an additional test that may be used for cervical cancer screening. The HPV test looks for the virus that can cause the cell changes on the cervix. The cells collected during the Pap test can be tested for HPV. The HPV test could be used to screen women aged 30 years and older, and should be used in women of any age who have unclear Pap test results. After the   age of 30, women should have HPV testing at the same frequency as a Pap test.  Colorectal cancer can be detected and often prevented. Most routine colorectal cancer screening begins at the age of 50 and continues through age 75. However, your caregiver may recommend screening at an earlier age if you have risk factors for colon cancer. On a yearly basis, your caregiver may provide home test kits to check for hidden blood in the stool. Use of a small camera at the end of a tube, to directly examine the colon (sigmoidoscopy or colonoscopy), can detect the earliest forms of colorectal cancer. Talk to your caregiver about this at age 50, when routine screening begins. Direct examination of the colon should be repeated every 5 to 10 years through age 75, unless early forms of pre-cancerous polyps or small growths are found.  Hepatitis C blood testing is recommended for all people born from 1945 through 1965 and any individual with known risks for hepatitis C.  Practice safe sex. Use condoms and avoid high-risk sexual practices to reduce the spread of sexually transmitted infections (STIs). STIs include gonorrhea, chlamydia, syphilis, trichomonas, herpes, HPV, and human immunodeficiency virus (HIV). Herpes, HIV, and HPV are viral illnesses that have no cure. They can result in disability, cancer, and death. Sexually active women aged 25 and younger should be checked for chlamydia. Older women with new or multiple partners should also be tested  for chlamydia. Testing for other STIs is recommended if you are sexually active and at increased risk.  Osteoporosis is a disease in which the bones lose minerals and strength with aging. This can result in serious bone fractures. The risk of osteoporosis can be identified using a bone density scan. Women ages 65 and over and women at risk for fractures or osteoporosis should discuss screening with their caregivers. Ask your caregiver whether you should take a calcium supplement or vitamin D to reduce the rate of osteoporosis.  Menopause can be associated with physical symptoms and risks. Hormone replacement therapy is available to decrease symptoms and risks. You should talk to your caregiver about whether hormone replacement therapy is right for you.  Use sunscreen with sun protection factor (SPF) of 30 or more. Apply sunscreen liberally and repeatedly throughout the day. You should seek shade when your shadow is shorter than you. Protect yourself by wearing long sleeves, pants, a wide-brimmed hat, and sunglasses year round, whenever you are outdoors.  Once a month, do a whole body skin exam, using a mirror to look at the skin on your back. Notify your caregiver of new moles, moles that have irregular borders, moles that are larger than a pencil eraser, or moles that have changed in shape or color.  Stay current with required immunizations.  Influenza. You need a dose every fall (or winter). The composition of the flu vaccine changes each year, so being vaccinated once is not enough.  Pneumococcal polysaccharide. You need 1 to 2 doses if you smoke cigarettes or if you have certain chronic medical conditions. You need 1 dose at age 65 (or older) if you have never been vaccinated.  Tetanus, diphtheria, pertussis (Tdap, Td). Get 1 dose of Tdap vaccine if you are younger than age 65, are over 65 and have contact with an infant, are a healthcare worker, are pregnant, or simply want to be protected from  whooping cough. After that, you need a Td booster dose every 10 years. Consult your caregiver if you have not had at least   3 tetanus and diphtheria-containing shots sometime in your life or have a deep or dirty wound.  HPV. You need this vaccine if you are a woman age 26 or younger. The vaccine is given in 3 doses over 6 months.  Measles, mumps, rubella (MMR). You need at least 1 dose of MMR if you were born in 1957 or later. You may also need a second dose.  Meningococcal. If you are age 19 to 21 and a first-year college student living in a residence hall, or have one of several medical conditions, you need to get vaccinated against meningococcal disease. You may also need additional booster doses.  Zoster (shingles). If you are age 60 or older, you should get this vaccine.  Varicella (chickenpox). If you have never had chickenpox or you were vaccinated but received only 1 dose, talk to your caregiver to find out if you need this vaccine.  Hepatitis A. You need this vaccine if you have a specific risk factor for hepatitis A virus infection or you simply wish to be protected from this disease. The vaccine is usually given as 2 doses, 6 to 18 months apart.  Hepatitis B. You need this vaccine if you have a specific risk factor for hepatitis B virus infection or you simply wish to be protected from this disease. The vaccine is given in 3 doses, usually over 6 months. Preventive Services / Frequency Ages 19 to 39  Blood pressure check.** / Every 1 to 2 years.  Lipid and cholesterol check.** / Every 5 years beginning at age 20.  Clinical breast exam.** / Every 3 years for women in their 20s and 30s.  Pap test.** / Every 2 years from ages 21 through 29. Every 3 years starting at age 30 through age 65 or 70 with a history of 3 consecutive normal Pap tests.  HPV screening.** / Every 3 years from ages 30 through ages 65 to 70 with a history of 3 consecutive normal Pap tests.  Hepatitis C blood  test.** / For any individual with known risks for hepatitis C.  Skin self-exam. / Monthly.  Influenza immunization.** / Every year.  Pneumococcal polysaccharide immunization.** / 1 to 2 doses if you smoke cigarettes or if you have certain chronic medical conditions.  Tetanus, diphtheria, pertussis (Tdap, Td) immunization. / A one-time dose of Tdap vaccine. After that, you need a Td booster dose every 10 years.  HPV immunization. / 3 doses over 6 months, if you are 26 and younger.  Measles, mumps, rubella (MMR) immunization. / You need at least 1 dose of MMR if you were born in 1957 or later. You may also need a second dose.  Meningococcal immunization. / 1 dose if you are age 19 to 21 and a first-year college student living in a residence hall, or have one of several medical conditions, you need to get vaccinated against meningococcal disease. You may also need additional booster doses.  Varicella immunization.** / Consult your caregiver.  Hepatitis A immunization.** / Consult your caregiver. 2 doses, 6 to 18 months apart.  Hepatitis B immunization.** / Consult your caregiver. 3 doses usually over 6 months. Ages 40 to 64  Blood pressure check.** / Every 1 to 2 years.  Lipid and cholesterol check.** / Every 5 years beginning at age 20.  Clinical breast exam.** / Every year after age 40.  Mammogram.** / Every year beginning at age 40 and continuing for as long as you are in good health. Consult with your caregiver.    Pap test.** / Every 3 years starting at age 30 through age 65 or 70 with a history of 3 consecutive normal Pap tests.  HPV screening.** / Every 3 years from ages 30 through ages 65 to 70 with a history of 3 consecutive normal Pap tests.  Fecal occult blood test (FOBT) of stool. / Every year beginning at age 50 and continuing until age 75. You may not need to do this test if you get a colonoscopy every 10 years.  Flexible sigmoidoscopy or colonoscopy.** / Every 5 years  for a flexible sigmoidoscopy or every 10 years for a colonoscopy beginning at age 50 and continuing until age 75.  Hepatitis C blood test.** / For all people born from 1945 through 1965 and any individual with known risks for hepatitis C.  Skin self-exam. / Monthly.  Influenza immunization.** / Every year.  Pneumococcal polysaccharide immunization.** / 1 to 2 doses if you smoke cigarettes or if you have certain chronic medical conditions.  Tetanus, diphtheria, pertussis (Tdap, Td) immunization.** / A one-time dose of Tdap vaccine. After that, you need a Td booster dose every 10 years.  Measles, mumps, rubella (MMR) immunization. / You need at least 1 dose of MMR if you were born in 1957 or later. You may also need a second dose.  Varicella immunization.** / Consult your caregiver.  Meningococcal immunization.** / Consult your caregiver.  Hepatitis A immunization.** / Consult your caregiver. 2 doses, 6 to 18 months apart.  Hepatitis B immunization.** / Consult your caregiver. 3 doses, usually over 6 months. Ages 65 and over  Blood pressure check.** / Every 1 to 2 years.  Lipid and cholesterol check.** / Every 5 years beginning at age 20.  Clinical breast exam.** / Every year after age 40.  Mammogram.** / Every year beginning at age 40 and continuing for as long as you are in good health. Consult with your caregiver.  Pap test.** / Every 3 years starting at age 30 through age 65 or 70 with a 3 consecutive normal Pap tests. Testing can be stopped between 65 and 70 with 3 consecutive normal Pap tests and no abnormal Pap or HPV tests in the past 10 years.  HPV screening.** / Every 3 years from ages 30 through ages 65 or 70 with a history of 3 consecutive normal Pap tests. Testing can be stopped between 65 and 70 with 3 consecutive normal Pap tests and no abnormal Pap or HPV tests in the past 10 years.  Fecal occult blood test (FOBT) of stool. / Every year beginning at age 50 and  continuing until age 75. You may not need to do this test if you get a colonoscopy every 10 years.  Flexible sigmoidoscopy or colonoscopy.** / Every 5 years for a flexible sigmoidoscopy or every 10 years for a colonoscopy beginning at age 50 and continuing until age 75.  Hepatitis C blood test.** / For all people born from 1945 through 1965 and any individual with known risks for hepatitis C.  Osteoporosis screening.** / A one-time screening for women ages 65 and over and women at risk for fractures or osteoporosis.  Skin self-exam. / Monthly.  Influenza immunization.** / Every year.  Pneumococcal polysaccharide immunization.** / 1 dose at age 65 (or older) if you have never been vaccinated.  Tetanus, diphtheria, pertussis (Tdap, Td) immunization. / A one-time dose of Tdap vaccine if you are over 65 and have contact with an infant, are a healthcare worker, or simply want to be   protected from whooping cough. After that, you need a Td booster dose every 10 years.  Varicella immunization.** / Consult your caregiver.  Meningococcal immunization.** / Consult your caregiver.  Hepatitis A immunization.** / Consult your caregiver. 2 doses, 6 to 18 months apart.  Hepatitis B immunization.** / Check with your caregiver. 3 doses, usually over 6 months. ** Family history and personal history of risk and conditions may change your caregiver's recommendations. Document Released: 04/02/2001 Document Revised: 04/29/2011 Document Reviewed: 07/02/2010 [employer] Patient Information 2014 [employer].  

## 2012-12-21 NOTE — ED Notes (Signed)
Pt still not in room. 

## 2012-12-21 NOTE — Progress Notes (Signed)
Went to pt room to perform right lower extremity venous duplex and patient was not in room. RN and PA do not know where pt is, instructed them to page me if patient returns before 4:30, if the patient returns after 4:30 they can call the operator to page sonographer at [facility].  12/21/2012 4:08 PM Gertie Fey, RVT, RDCS, RDMS

## 2012-12-21 NOTE — ED Notes (Signed)
Pt saw her PCP today for a sorethroat but forgot to mention her tingling in her legs

## 2012-12-22 NOTE — ED Provider Notes (Signed)
Medical screening examination/treatment/procedure(s) were performed by non-physician practitioner and as supervising physician I was immediately available for consultation/collaboration.  Jabree Pernice T Pier Bosher, MD 12/22/12 [id number] 

## 2013-03-18 ENCOUNTER — Encounter (INDEPENDENT_AMBULATORY_CARE_PROVIDER_SITE_OTHER): Payer: Self-pay

## 2013-03-18 ENCOUNTER — Other Ambulatory Visit (HOSPITAL_BASED_OUTPATIENT_CLINIC_OR_DEPARTMENT_OTHER): Payer: Medicare Other

## 2013-03-18 DIAGNOSIS — C50419 Malignant neoplasm of upper-outer quadrant of unspecified female breast: Secondary | ICD-10-CM

## 2013-03-18 DIAGNOSIS — C50919 Malignant neoplasm of unspecified site of unspecified female breast: Secondary | ICD-10-CM

## 2013-03-18 LAB — COMPREHENSIVE METABOLIC PANEL (CC13)
ALK PHOS: 67 U/L (ref 40–150)
ALT: 18 U/L (ref 0–55)
ANION GAP: 10 meq/L (ref 3–11)
AST: 15 U/L (ref 5–34)
Albumin: 3.9 g/dL (ref 3.5–5.0)
BILIRUBIN TOTAL: 0.46 mg/dL (ref 0.20–1.20)
BUN: 11 mg/dL (ref 7.0–26.0)
CO2: 25 meq/L (ref 22–29)
CREATININE: 0.8 mg/dL (ref 0.6–1.1)
Calcium: 9.5 mg/dL (ref 8.4–10.4)
Chloride: 107 mEq/L (ref 98–109)
Glucose: 104 mg/dl (ref 70–140)
Potassium: 3.9 mEq/L (ref 3.5–5.1)
Sodium: 142 mEq/L (ref 136–145)
Total Protein: 7.8 g/dL (ref 6.4–8.3)

## 2013-03-18 LAB — CBC WITH DIFFERENTIAL/PLATELET
BASO%: 0.9 % (ref 0.0–2.0)
Basophils Absolute: 0.1 10*3/uL (ref 0.0–0.1)
EOS%: 1.2 % (ref 0.0–7.0)
Eosinophils Absolute: 0.1 10*3/uL (ref 0.0–0.5)
HEMATOCRIT: 40 % (ref 34.8–46.6)
HGB: 13.6 g/dL (ref 11.6–15.9)
LYMPH%: 48.2 % (ref 14.0–49.7)
MCH: 29.8 pg (ref 25.1–34.0)
MCHC: 33.9 g/dL (ref 31.5–36.0)
MCV: 87.9 fL (ref 79.5–101.0)
MONO#: 0.3 10*3/uL (ref 0.1–0.9)
MONO%: 5 % (ref 0.0–14.0)
NEUT#: 3.1 10*3/uL (ref 1.5–6.5)
NEUT%: 44.7 % (ref 38.4–76.8)
Platelets: 199 10*3/uL (ref 145–400)
RBC: 4.55 10*6/uL (ref 3.70–5.45)
RDW: 13 % (ref 11.2–14.5)
WBC: 6.9 10*3/uL (ref 3.9–10.3)
lymph#: 3.3 10*3/uL (ref 0.9–3.3)

## 2013-03-25 ENCOUNTER — Encounter: Payer: Self-pay | Admitting: Oncology

## 2013-03-25 ENCOUNTER — Ambulatory Visit (HOSPITAL_BASED_OUTPATIENT_CLINIC_OR_DEPARTMENT_OTHER): Payer: Medicare Other | Admitting: Oncology

## 2013-03-25 ENCOUNTER — Telehealth: Payer: Self-pay | Admitting: Oncology

## 2013-03-25 VITALS — BP 151/95 | HR 83 | Temp 98.7°F | Resp 18 | Ht 65.0 in | Wt 233.7 lb

## 2013-03-25 DIAGNOSIS — R7309 Other abnormal glucose: Secondary | ICD-10-CM

## 2013-03-25 DIAGNOSIS — F17200 Nicotine dependence, unspecified, uncomplicated: Secondary | ICD-10-CM

## 2013-03-25 DIAGNOSIS — C50419 Malignant neoplasm of upper-outer quadrant of unspecified female breast: Secondary | ICD-10-CM

## 2013-03-25 DIAGNOSIS — I10 Essential (primary) hypertension: Secondary | ICD-10-CM

## 2013-03-25 DIAGNOSIS — E669 Obesity, unspecified: Secondary | ICD-10-CM

## 2013-03-25 DIAGNOSIS — F411 Generalized anxiety disorder: Secondary | ICD-10-CM

## 2013-03-25 DIAGNOSIS — C50919 Malignant neoplasm of unspecified site of unspecified female breast: Secondary | ICD-10-CM

## 2013-03-25 MED ORDER — GABAPENTIN 300 MG PO CAPS: 300.0000 mg | ORAL_CAPSULE | Freq: Every day | ORAL | Status: DC

## 2013-03-25 MED ORDER — TAMOXIFEN CITRATE 20 MG PO TABS: 20.0000 mg | ORAL_TABLET | Freq: Every day | ORAL | Status: DC
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
## 2013-03-25 NOTE — Telephone Encounter (Signed)
, °

## 2013-05-10 ENCOUNTER — Other Ambulatory Visit: Payer: Self-pay | Admitting: *Deleted

## 2013-05-10 DIAGNOSIS — C50919 Malignant neoplasm of unspecified site of unspecified female breast: Secondary | ICD-10-CM

## 2013-05-10 MED ORDER — TAMOXIFEN CITRATE 20 MG PO TABS
20.0000 mg | ORAL_TABLET | Freq: Every day | ORAL | Status: DC
Start: 2013-05-10 — End: 2014-03-31

## 2013-05-18 ENCOUNTER — Encounter: Payer: Self-pay | Admitting: *Deleted

## 2013-05-18 NOTE — Progress Notes (Signed)
This RN faxed a request to [facility] for a [place], [id number] and a [id number].

## 2013-10-12 ENCOUNTER — Other Ambulatory Visit: Payer: Self-pay | Admitting: Oncology

## 2013-10-12 DIAGNOSIS — Z1231 Encounter for screening mammogram for malignant neoplasm of breast: Secondary | ICD-10-CM

## 2013-10-27 ENCOUNTER — Ambulatory Visit: Payer: Medicare Other

## 2013-12-01 ENCOUNTER — Other Ambulatory Visit: Payer: Self-pay | Admitting: Internal Medicine

## 2013-12-03 ENCOUNTER — Ambulatory Visit
Admission: RE | Admit: 2013-12-03 | Discharge: 2013-12-03 | Disposition: A | Discharge status: Home / Self Care | Payer: Medicare Other | Source: Ambulatory Visit | Attending: Oncology | Admitting: Oncology

## 2013-12-03 DIAGNOSIS — Z1231 Encounter for screening mammogram for malignant neoplasm of breast: Secondary | ICD-10-CM

## 2013-12-31 ENCOUNTER — Encounter: Payer: Medicare Other | Admitting: Internal Medicine

## 2014-03-20 ENCOUNTER — Other Ambulatory Visit: Payer: Self-pay | Admitting: Internal Medicine

## 2014-03-24 ENCOUNTER — Other Ambulatory Visit (HOSPITAL_BASED_OUTPATIENT_CLINIC_OR_DEPARTMENT_OTHER): Payer: Medicare Other

## 2014-03-24 DIAGNOSIS — C50412 Malignant neoplasm of upper-outer quadrant of left female breast: Secondary | ICD-10-CM | POA: Diagnosis not present

## 2014-03-24 DIAGNOSIS — I10 Essential (primary) hypertension: Secondary | ICD-10-CM

## 2014-03-24 DIAGNOSIS — C50919 Malignant neoplasm of unspecified site of unspecified female breast: Secondary | ICD-10-CM

## 2014-03-24 LAB — CBC WITH DIFFERENTIAL/PLATELET
BASO%: 0.3 % (ref 0.0–2.0)
Basophils Absolute: 0 10*3/uL (ref 0.0–0.1)
EOS%: 0.9 % (ref 0.0–7.0)
Eosinophils Absolute: 0.1 10*3/uL (ref 0.0–0.5)
HEMATOCRIT: 40.5 % (ref 34.8–46.6)
HGB: 13.5 g/dL (ref 11.6–15.9)
LYMPH%: 46 % (ref 14.0–49.7)
MCH: 29.5 pg (ref 25.1–34.0)
MCHC: 33.3 g/dL (ref 31.5–36.0)
MCV: 88.6 fL (ref 79.5–101.0)
MONO#: 0.4 10*3/uL (ref 0.1–0.9)
MONO%: 5.6 % (ref 0.0–14.0)
NEUT%: 47.2 % (ref 38.4–76.8)
NEUTROS ABS: 3.2 10*3/uL (ref 1.5–6.5)
PLATELETS: 181 10*3/uL (ref 145–400)
RBC: 4.57 10*6/uL (ref 3.70–5.45)
RDW: 12.9 % (ref 11.2–14.5)
WBC: 6.7 10*3/uL (ref 3.9–10.3)
lymph#: 3.1 10*3/uL (ref 0.9–3.3)

## 2014-03-24 LAB — COMPREHENSIVE METABOLIC PANEL (CC13)
ALT: 22 U/L (ref 0–55)
ANION GAP: 11 meq/L (ref 3–11)
AST: 19 U/L (ref 5–34)
Albumin: 4 g/dL (ref 3.5–5.0)
Alkaline Phosphatase: 73 U/L (ref 40–150)
BILIRUBIN TOTAL: 0.56 mg/dL (ref 0.20–1.20)
BUN: 11.2 mg/dL (ref 7.0–26.0)
CALCIUM: 9.5 mg/dL (ref 8.4–10.4)
CO2: 26 mEq/L (ref 22–29)
Chloride: 106 mEq/L (ref 98–109)
Creatinine: 0.8 mg/dL (ref 0.6–1.1)
Glucose: 111 mg/dl (ref 70–140)
Potassium: 3.9 mEq/L (ref 3.5–5.1)
Sodium: 143 mEq/L (ref 136–145)
Total Protein: 7.7 g/dL (ref 6.4–8.3)

## 2014-03-25 ENCOUNTER — Encounter: Payer: Self-pay | Admitting: Internal Medicine

## 2014-03-25 ENCOUNTER — Other Ambulatory Visit (INDEPENDENT_AMBULATORY_CARE_PROVIDER_SITE_OTHER): Payer: Medicare Other

## 2014-03-25 ENCOUNTER — Ambulatory Visit (INDEPENDENT_AMBULATORY_CARE_PROVIDER_SITE_OTHER): Payer: Medicare Other | Admitting: Internal Medicine

## 2014-03-25 VITALS — BP 162/90 | HR 100 | Temp 98.9°F | Resp 16 | Ht 65.0 in | Wt 233.2 lb

## 2014-03-25 DIAGNOSIS — F1210 Cannabis abuse, uncomplicated: Secondary | ICD-10-CM

## 2014-03-25 DIAGNOSIS — Z23 Encounter for immunization: Secondary | ICD-10-CM

## 2014-03-25 DIAGNOSIS — Z0000 Encounter for general adult medical examination without abnormal findings: Secondary | ICD-10-CM | POA: Diagnosis not present

## 2014-03-25 DIAGNOSIS — R739 Hyperglycemia, unspecified: Secondary | ICD-10-CM

## 2014-03-25 DIAGNOSIS — I10 Essential (primary) hypertension: Secondary | ICD-10-CM

## 2014-03-25 DIAGNOSIS — F3162 Bipolar disorder, current episode mixed, moderate: Secondary | ICD-10-CM | POA: Insufficient documentation

## 2014-03-25 DIAGNOSIS — G4700 Insomnia, unspecified: Secondary | ICD-10-CM | POA: Diagnosis not present

## 2014-03-25 LAB — HEMOGLOBIN A1C: Hgb A1c MFr Bld: 6.1 % (ref 4.6–6.5)

## 2014-03-25 LAB — LIPID PANEL
Cholesterol: 230 mg/dL — ABNORMAL HIGH (ref 0–200)
HDL: 49.9 mg/dL (ref >39.00)
NonHDL: 180.1
Total CHOL/HDL Ratio: 5
Triglycerides: 206 mg/dL — ABNORMAL HIGH (ref 0.0–149.0)
VLDL: 41.2 mg/dL — AB (ref 0.0–40.0)

## 2014-03-25 LAB — URINALYSIS, ROUTINE W REFLEX MICROSCOPIC
BILIRUBIN URINE: NEGATIVE
Hgb urine dipstick: NEGATIVE
Leukocytes, UA: NEGATIVE
Nitrite: NEGATIVE
PH: 6 (ref 5.0–8.0)
Specific Gravity, Urine: >=1.03 — AB (ref 1.000–1.030)
Urine Glucose: NEGATIVE
Urobilinogen, UA: 1 (ref 0.0–1.0)

## 2014-03-25 LAB — LDL CHOLESTEROL, DIRECT: Direct LDL: 157 mg/dL

## 2014-03-25 LAB — TSH: TSH: 1.1 u[IU]/mL (ref 0.35–4.50)

## 2014-03-25 MED ORDER — SUVOREXANT 10 MG PO TABS: 1.0000 | ORAL_TABLET | Freq: Every evening | ORAL | Status: DC | PRN

## 2014-03-25 MED ORDER — METOPROLOL TARTRATE 50 MG PO TABS: 25.0000 mg | ORAL_TABLET | Freq: Two times a day (BID) | ORAL | Status: DC

## 2014-03-25 NOTE — Patient Instructions (Signed)

## 2014-03-25 NOTE — Assessment & Plan Note (Signed)
Her heart rate and BP are elevated - there are several possible causes for this: anxiety, beta-blocker withdrawal, tobacco abuse, substance abuse (?), anemia (?) Will restart the metoprolol Will check her labs for secondary causes

## 2014-03-25 NOTE — Progress Notes (Signed)
Subjective:    Patient ID: Jasmin Hernandez, female    DOB: 1965/10/04, 49 y.o.   MRN: [id number]  HPI Comments: She ran out of metoprolol about a week ago and complains that her heart rate and BP are up.  Hypertension This is a chronic problem. The current episode started more than 1 year ago. The problem has been rapidly worsening since onset. The problem is uncontrolled. Associated symptoms include anxiety. Pertinent negatives include no blurred vision, chest pain, headaches, malaise/fatigue, neck pain, orthopnea, palpitations, peripheral edema, PND, shortness of breath or sweats. Past treatments include beta blockers. The current treatment provides no improvement. Compliance problems include psychosocial issues, exercise and diet.       Review of Systems  Constitutional: Negative.  Negative for fever, chills, malaise/fatigue, diaphoresis, appetite change and fatigue.  HENT: Negative.   Eyes: Negative.  Negative for blurred vision.  Respiratory: Negative.  Negative for cough, choking, chest tightness, shortness of breath and stridor.   Cardiovascular: Negative.  Negative for chest pain, palpitations, orthopnea, leg swelling and PND.  Gastrointestinal: Negative.  Negative for nausea, vomiting, abdominal pain, diarrhea, constipation and blood in stool.  Endocrine: Negative.   Genitourinary: Negative.   Musculoskeletal: Negative.  Negative for myalgias, back pain, arthralgias and neck pain.  Skin: Negative.  Negative for rash.  Allergic/Immunologic: Negative.   Neurological: Negative.  Negative for headaches.  Hematological: Negative.  Negative for adenopathy. Does not bruise/bleed easily.  Psychiatric/Behavioral: Positive for sleep disturbance and dysphoric mood. Negative for suicidal ideas, hallucinations, behavioral problems, confusion, self-injury, decreased concentration and agitation. The patient is nervous/anxious. The patient is not hyperactive.        Objective:   Physical  Exam  Constitutional: She is oriented to person, place, and time. She appears well-developed and well-nourished.  Non-toxic appearance. She does not have a sickly appearance. She does not appear ill. No distress.  HENT:  Head: Normocephalic and atraumatic.  Mouth/Throat: Oropharynx is clear and moist. No oropharyngeal exudate.  Eyes: Conjunctivae are normal. Right eye exhibits no discharge. Left eye exhibits no discharge. No scleral icterus.  Neck: Normal range of motion. Neck supple. No JVD present. No tracheal deviation present. No thyromegaly present.  Cardiovascular: Regular rhythm and normal heart sounds.  Tachycardia present.  Exam reveals no gallop and no friction rub.   No murmur heard. Pulmonary/Chest: Effort normal and breath sounds normal. No stridor. No respiratory distress. She has no wheezes. She has no rales. She exhibits no tenderness.  Abdominal: Soft. Bowel sounds are normal. She exhibits no distension and no mass. There is no tenderness. There is no rebound and no guarding.  Musculoskeletal: Normal range of motion. She exhibits no edema or tenderness.  Lymphadenopathy:    She has no cervical adenopathy.  Neurological: She is oriented to person, place, and time.  Skin: Skin is warm and dry. No rash noted. She is not diaphoretic. No erythema. No pallor.  Psychiatric: Her behavior is normal. Judgment and thought content normal. Her mood appears anxious. Her affect is not angry, not blunt, not labile and not inappropriate. Her speech is not rapid and/or pressured, not delayed, not tangential and not slurred. She is not slowed, not withdrawn, not actively hallucinating and not combative. Cognition and memory are normal. She exhibits a depressed mood. She expresses no homicidal and no suicidal ideation. She expresses no suicidal plans and no homicidal plans. She is communicative. She is attentive.     Lab Results  Component Value Date   WBC  6.7 03/24/2014   HGB 13.5 03/24/2014    HCT 40.5 03/24/2014   PLT 181 03/24/2014   GLUCOSE 111 03/24/2014   CHOL 175 12/21/2012   TRIG 246.0* 12/21/2012   HDL 52.60 12/21/2012   LDLDIRECT 95.9 12/21/2012   ALT 22 03/24/2014   AST 19 03/24/2014   NA 143 03/24/2014   K 3.9 03/24/2014   CL 105 12/21/2012   CREATININE 0.8 03/24/2014   BUN 11.2 03/24/2014   CO2 26 03/24/2014   TSH 1.06 12/21/2012   INR 0.94 12/07/2010   HGBA1C 6.2 12/21/2012       Assessment & Plan:

## 2014-03-25 NOTE — Assessment & Plan Note (Signed)
I will check her A1C to see if she has developed DM2 

## 2014-03-25 NOTE — Progress Notes (Signed)
Pre visit review using our clinic review tool, if applicable. No additional management support is needed unless otherwise documented below in the visit note. 

## 2014-03-25 NOTE — Assessment & Plan Note (Signed)
I have asked her to see psych to have this evaluated and treated

## 2014-03-25 NOTE — Assessment & Plan Note (Signed)
The patient is here for annual Medicare wellness examination and management of other chronic and acute problems.   The risk factors are reflected in the social history.  The roster of all physicians providing medical care to patient - is listed in the Snapshot section of the chart.  Activities of daily living:  The patient is 100% inedpendent in all ADLs: dressing, toileting, feeding as well as independent mobility  Home safety : The patient has smoke detectors in the home. They wear seatbelts.No firearms at home ( firearms are present in the home, kept in a safe fashion). There is no violence in the home.   There is no risks for hepatitis, STDs or HIV. There is no   history of blood transfusion. They have no travel history to infectious disease endemic areas of the world.  The patient has (has not) seen their dentist in the last six month. They have (not) seen their eye doctor in the last year. They deny (admit to) any hearing difficulty and have not had audiologic testing in the last year.  They do not  have excessive sun exposure. Discussed the need for sun protection: hats, long sleeves and use of sunscreen if there is significant sun exposure.   Diet: the importance of a healthy diet is discussed. They do have a healthy (unhealthy-high fat/fast food) diet.  The benefits of regular aerobic exercise were discussed.  Depression screen: there are no signs or vegative symptoms of depression- irritability, change in appetite, anhedonia, sadness/tearfullness.  Cognitive assessment: the patient manages all their financial and personal affairs and is actively engaged. They could relate day,date,year and events; recalled 3/3 objects at 3 minutes; performed clock-face test normally.  The following portions of the patient's history were reviewed and updated as appropriate: allergies, current medications, past family history, past medical history,  past surgical history, past social history  and  problem list.  Vision, hearing, body mass index were assessed and reviewed.   During the course of the visit the patient was educated and counseled about appropriate screening and preventive services including : fall prevention , diabetes screening, nutrition counseling, colorectal cancer screening, and recommended immunizations.  

## 2014-03-25 NOTE — Assessment & Plan Note (Signed)
She will try belsomra for this

## 2014-03-26 LAB — DRUGS OF ABUSE SCREEN W/O ALC, ROUTINE URINE
Amphetamine Screen, Ur: NEGATIVE
BENZODIAZEPINES.: NEGATIVE
Barbiturate Quant, Ur: NEGATIVE
Cocaine Metabolites: NEGATIVE
Creatinine,U: 448 mg/dL
MARIJUANA METABOLITE: POSITIVE — AB
METHADONE: NEGATIVE
Opiate Screen, Urine: NEGATIVE
Phencyclidine (PCP): NEGATIVE
Propoxyphene: NEGATIVE

## 2014-03-28 ENCOUNTER — Encounter: Payer: Self-pay | Admitting: Internal Medicine

## 2014-03-28 DIAGNOSIS — F1210 Cannabis abuse, uncomplicated: Secondary | ICD-10-CM | POA: Insufficient documentation

## 2014-03-30 LAB — CANNABANOIDS (GC/LC/MS), URINE: THC-COOH (GC/LC/MS), ur confirm: 54 ng/mL — ABNORMAL HIGH (ref <5)

## 2014-03-31 ENCOUNTER — Encounter: Payer: Self-pay | Admitting: Nurse Practitioner

## 2014-03-31 ENCOUNTER — Telehealth: Payer: Self-pay | Admitting: Nurse Practitioner

## 2014-03-31 ENCOUNTER — Ambulatory Visit (HOSPITAL_BASED_OUTPATIENT_CLINIC_OR_DEPARTMENT_OTHER): Payer: Medicare Other | Admitting: Nurse Practitioner

## 2014-03-31 VITALS — BP 155/91 | HR 73 | Temp 98.7°F | Resp 19 | Ht 65.0 in | Wt 232.9 lb

## 2014-03-31 DIAGNOSIS — D0512 Intraductal carcinoma in situ of left breast: Secondary | ICD-10-CM

## 2014-03-31 DIAGNOSIS — N951 Menopausal and female climacteric states: Secondary | ICD-10-CM | POA: Diagnosis not present

## 2014-03-31 DIAGNOSIS — R232 Flushing: Secondary | ICD-10-CM | POA: Insufficient documentation

## 2014-03-31 DIAGNOSIS — Z720 Tobacco use: Secondary | ICD-10-CM

## 2014-03-31 DIAGNOSIS — F319 Bipolar disorder, unspecified: Secondary | ICD-10-CM | POA: Diagnosis not present

## 2014-03-31 DIAGNOSIS — C50912 Malignant neoplasm of unspecified site of left female breast: Secondary | ICD-10-CM

## 2014-03-31 MED ORDER — TAMOXIFEN CITRATE 20 MG PO TABS: 20.0000 mg | ORAL_TABLET | Freq: Every day | ORAL | Status: DC

## 2014-03-31 MED ORDER — CLONIDINE HCL 0.1 MG/24HR TD PTWK: 0.1000 mg | MEDICATED_PATCH | TRANSDERMAL | Status: DC
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
## 2014-03-31 NOTE — Telephone Encounter (Signed)
, °

## 2014-03-31 NOTE — Addendum Note (Signed)
Addended by: Marcelino Duster on: 03/31/2014 05:01 PM   Modules accepted: Orders

## 2014-04-05 ENCOUNTER — Other Ambulatory Visit (HOSPITAL_COMMUNITY)
Admission: RE | Admit: 2014-04-05 | Discharge: 2014-04-05 | Disposition: A | Discharge status: Home / Self Care | Payer: Medicare Other | Source: Ambulatory Visit | Attending: Nurse Practitioner | Admitting: Nurse Practitioner

## 2014-04-05 ENCOUNTER — Other Ambulatory Visit: Payer: Self-pay | Admitting: Nurse Practitioner

## 2014-04-05 DIAGNOSIS — Z124 Encounter for screening for malignant neoplasm of cervix: Secondary | ICD-10-CM | POA: Diagnosis not present

## 2014-04-05 DIAGNOSIS — Z1151 Encounter for screening for human papillomavirus (HPV): Secondary | ICD-10-CM | POA: Insufficient documentation

## 2014-04-05 DIAGNOSIS — Z01419 Encounter for gynecological examination (general) (routine) without abnormal findings: Secondary | ICD-10-CM | POA: Diagnosis not present

## 2014-04-06 LAB — CYTOLOGY - PAP

## 2014-04-28 DIAGNOSIS — N910 Primary amenorrhea: Secondary | ICD-10-CM | POA: Diagnosis not present

## 2014-05-12 DIAGNOSIS — N926 Irregular menstruation, unspecified: Secondary | ICD-10-CM | POA: Diagnosis not present

## 2014-06-23 ENCOUNTER — Other Ambulatory Visit (HOSPITAL_COMMUNITY): Payer: Self-pay | Admitting: Nurse Practitioner

## 2014-06-23 DIAGNOSIS — N926 Irregular menstruation, unspecified: Secondary | ICD-10-CM | POA: Diagnosis not present

## 2014-06-23 DIAGNOSIS — N840 Polyp of corpus uteri: Secondary | ICD-10-CM | POA: Diagnosis not present

## 2014-06-23 DIAGNOSIS — R938 Abnormal findings on diagnostic imaging of other specified body structures: Secondary | ICD-10-CM | POA: Diagnosis not present

## 2014-08-01 DIAGNOSIS — C50919 Malignant neoplasm of unspecified site of unspecified female breast: Secondary | ICD-10-CM | POA: Diagnosis not present

## 2014-08-01 DIAGNOSIS — Z9010 Acquired absence of unspecified breast and nipple: Secondary | ICD-10-CM | POA: Diagnosis not present

## 2014-08-30 DIAGNOSIS — C50919 Malignant neoplasm of unspecified site of unspecified female breast: Secondary | ICD-10-CM | POA: Diagnosis not present

## 2014-08-30 DIAGNOSIS — Z9010 Acquired absence of unspecified breast and nipple: Secondary | ICD-10-CM | POA: Diagnosis not present

## 2015-01-26 ENCOUNTER — Other Ambulatory Visit: Payer: Self-pay

## 2015-01-26 DIAGNOSIS — Z1231 Encounter for screening mammogram for malignant neoplasm of breast: Secondary | ICD-10-CM

## 2015-02-22 ENCOUNTER — Ambulatory Visit
Admission: RE | Admit: 2015-02-22 | Discharge: 2015-02-22 | Disposition: A | Discharge status: Home / Self Care | Payer: Medicare Other | Source: Ambulatory Visit

## 2015-02-22 DIAGNOSIS — Z1231 Encounter for screening mammogram for malignant neoplasm of breast: Secondary | ICD-10-CM

## 2015-04-03 ENCOUNTER — Other Ambulatory Visit: Payer: Medicare Other

## 2015-04-03 ENCOUNTER — Ambulatory Visit: Payer: Medicare Other | Admitting: Oncology

## 2015-04-03 NOTE — Progress Notes (Signed)
No show

## 2015-07-14 ENCOUNTER — Other Ambulatory Visit: Payer: Self-pay | Admitting: Internal Medicine

## 2015-07-27 ENCOUNTER — Telehealth: Payer: Self-pay | Admitting: *Deleted

## 2015-07-27 NOTE — Telephone Encounter (Signed)
Left msg on triage stating needing refill on BP med. Called pt back no answer LMOM refill was denied need appt haven't seen MD since 03/2014...Johny Chess

## 2015-07-29 DIAGNOSIS — N390 Urinary tract infection, site not specified: Secondary | ICD-10-CM | POA: Diagnosis not present

## 2015-07-29 DIAGNOSIS — Z9114 Patient's other noncompliance with medication regimen: Secondary | ICD-10-CM | POA: Diagnosis not present

## 2015-07-29 DIAGNOSIS — N760 Acute vaginitis: Secondary | ICD-10-CM | POA: Diagnosis not present

## 2015-07-29 DIAGNOSIS — Z9010 Acquired absence of unspecified breast and nipple: Secondary | ICD-10-CM | POA: Diagnosis not present

## 2015-07-29 DIAGNOSIS — I10 Essential (primary) hypertension: Secondary | ICD-10-CM | POA: Diagnosis not present

## 2015-07-29 DIAGNOSIS — R202 Paresthesia of skin: Secondary | ICD-10-CM | POA: Diagnosis not present

## 2015-07-29 DIAGNOSIS — H538 Other visual disturbances: Secondary | ICD-10-CM | POA: Diagnosis not present

## 2015-07-29 DIAGNOSIS — Z885 Allergy status to narcotic agent status: Secondary | ICD-10-CM | POA: Diagnosis not present

## 2015-07-29 DIAGNOSIS — R51 Headache: Secondary | ICD-10-CM | POA: Diagnosis not present

## 2015-07-29 DIAGNOSIS — R000 Tachycardia, unspecified: Secondary | ICD-10-CM | POA: Diagnosis not present

## 2015-07-29 DIAGNOSIS — B9689 Other specified bacterial agents as the cause of diseases classified elsewhere: Secondary | ICD-10-CM | POA: Diagnosis not present

## 2015-07-29 DIAGNOSIS — C50919 Malignant neoplasm of unspecified site of unspecified female breast: Secondary | ICD-10-CM | POA: Diagnosis not present

## 2015-09-19 ENCOUNTER — Encounter: Payer: Self-pay | Admitting: Internal Medicine

## 2015-09-19 ENCOUNTER — Ambulatory Visit (INDEPENDENT_AMBULATORY_CARE_PROVIDER_SITE_OTHER): Payer: Medicare Other | Admitting: Internal Medicine

## 2015-09-19 ENCOUNTER — Other Ambulatory Visit (INDEPENDENT_AMBULATORY_CARE_PROVIDER_SITE_OTHER): Payer: Medicare Other

## 2015-09-19 VITALS — BP 152/102 | HR 102 | Temp 98.4°F | Resp 16 | Wt 224.0 lb

## 2015-09-19 DIAGNOSIS — I10 Essential (primary) hypertension: Secondary | ICD-10-CM | POA: Diagnosis not present

## 2015-09-19 DIAGNOSIS — Z0000 Encounter for general adult medical examination without abnormal findings: Secondary | ICD-10-CM

## 2015-09-19 DIAGNOSIS — K589 Irritable bowel syndrome without diarrhea: Secondary | ICD-10-CM | POA: Diagnosis not present

## 2015-09-19 DIAGNOSIS — R739 Hyperglycemia, unspecified: Secondary | ICD-10-CM

## 2015-09-19 DIAGNOSIS — G4700 Insomnia, unspecified: Secondary | ICD-10-CM | POA: Diagnosis not present

## 2015-09-19 DIAGNOSIS — E785 Hyperlipidemia, unspecified: Secondary | ICD-10-CM

## 2015-09-19 LAB — CBC WITH DIFFERENTIAL/PLATELET
Basophils Absolute: 0 10*3/uL (ref 0.0–0.1)
Basophils Relative: 0.5 % (ref 0.0–3.0)
Eosinophils Absolute: 0.1 10*3/uL (ref 0.0–0.7)
Eosinophils Relative: 0.9 % (ref 0.0–5.0)
HEMATOCRIT: 41.4 % (ref 36.0–46.0)
HEMOGLOBIN: 14.2 g/dL (ref 12.0–15.0)
LYMPHS ABS: 3.5 10*3/uL (ref 0.7–4.0)
Lymphocytes Relative: 41.4 % (ref 12.0–46.0)
MCHC: 34.3 g/dL (ref 30.0–36.0)
MCV: 87.9 fl (ref 78.0–100.0)
MONO ABS: 0.4 10*3/uL (ref 0.1–1.0)
MONOS PCT: 4.5 % (ref 3.0–12.0)
NEUTROS ABS: 4.4 10*3/uL (ref 1.4–7.7)
Neutrophils Relative %: 52.7 % (ref 43.0–77.0)
Platelets: 189 10*3/uL (ref 150.0–400.0)
RBC: 4.72 Mil/uL (ref 3.87–5.11)
RDW: 13.5 % (ref 11.5–15.5)
WBC: 8.4 10*3/uL (ref 4.0–10.5)

## 2015-09-19 LAB — COMPREHENSIVE METABOLIC PANEL
ALBUMIN: 4.3 g/dL (ref 3.5–5.2)
ALK PHOS: 78 U/L (ref 39–117)
ALT: 28 U/L (ref 0–35)
AST: 26 U/L (ref 0–37)
BILIRUBIN TOTAL: 0.5 mg/dL (ref 0.2–1.2)
BUN: 17 mg/dL (ref 6–23)
CALCIUM: 9.6 mg/dL (ref 8.4–10.5)
CO2: 28 mEq/L (ref 19–32)
Chloride: 105 mEq/L (ref 96–112)
Creatinine, Ser: 0.85 mg/dL (ref 0.40–1.20)
GFR: 91.86 mL/min (ref >60.00)
GLUCOSE: 94 mg/dL (ref 70–99)
Potassium: 3.4 mEq/L — ABNORMAL LOW (ref 3.5–5.1)
Sodium: 141 mEq/L (ref 135–145)
TOTAL PROTEIN: 8.2 g/dL (ref 6.0–8.3)

## 2015-09-19 LAB — LIPID PANEL
CHOL/HDL RATIO: 5
Cholesterol: 261 mg/dL — ABNORMAL HIGH (ref 0–200)
HDL: 53.3 mg/dL (ref >39.00)
LDL CALC: 172 mg/dL — AB (ref 0–99)
NONHDL: 207.27
Triglycerides: 176 mg/dL — ABNORMAL HIGH (ref 0.0–149.0)
VLDL: 35.2 mg/dL (ref 0.0–40.0)

## 2015-09-19 LAB — HEMOGLOBIN A1C: Hgb A1c MFr Bld: 5.8 % (ref 4.6–6.5)

## 2015-09-19 LAB — TSH: TSH: 0.83 u[IU]/mL (ref 0.35–4.50)

## 2015-09-19 MED ORDER — ROSUVASTATIN CALCIUM 20 MG PO TABS: 20.0000 mg | ORAL_TABLET | Freq: Every day | ORAL | 3 refills | Status: DC

## 2015-09-19 MED ORDER — SUVOREXANT 20 MG PO TABS: 1.0000 | ORAL_TABLET | Freq: Every evening | ORAL | 5 refills | Status: DC | PRN

## 2015-09-19 MED ORDER — METOPROLOL TARTRATE 50 MG PO TABS: 25.0000 mg | ORAL_TABLET | Freq: Two times a day (BID) | ORAL | 11 refills | Status: DC

## 2015-09-19 MED ORDER — ELUXADOLINE 75 MG PO TABS: 1.0000 | ORAL_TABLET | Freq: Two times a day (BID) | ORAL | 5 refills | Status: DC

## 2015-09-19 NOTE — Progress Notes (Signed)
Subjective:  Patient ID: Jasmin Hernandez, female    DOB: 02/24/1965  Age: 50 y.o. MRN: [id number]  CC: Annual Exam; Abdominal Pain; and Hypertension   HPI Jasmin Hernandez presents for a CPX.  She has been out of her blood pressure medicine for several weeks now and is concerned that her blood pressure has been high. She has had no headaches/blurred vision/chest pain/shortness of breath/palpitations/edema/or fatigue.  She also complains of persistent insomnia with difficulty falling asleep and frequent awakening. She has been taking  Belsomra at the 10 mg dose and wants to try a higher dose.   She also complains of a 3 year history of intermittent cramping in bilateral lower quadrants of her abdomen with frequent and urgent bowel movements during the day. She's had no loss of appetite, bloody stool, nausea, vomiting, dysuria, or hematuria. She rarely has alternations between constipation and diarrhea.  Outpatient Medications Prior to Visit  Medication Sig Dispense Refill  . tamoxifen (NOLVADEX) 20 MG tablet Take 1 tablet (20 mg total) by mouth daily. 90 tablet 3  . metoprolol (LOPRESSOR) 50 MG tablet Take 0.5 tablets (25 mg total) by mouth 2 (two) times daily. 60 tablet 11  . Suvorexant (BELSOMRA) 10 MG TABS Take 1 tablet by mouth at bedtime as needed. 30 tablet 2  . cloNIDine (CATAPRES - DOSED IN MG/24 HR) 0.1 mg/24hr patch Place 1 patch (0.1 mg total) onto the skin once a week. (Patient not taking: Reported on 09/19/2015) 4 patch 5   No facility-administered medications prior to visit.     ROS Review of Systems  Constitutional: Negative.  Negative for activity change, chills, diaphoresis, fatigue and fever.  HENT: Negative.  Negative for trouble swallowing.   Eyes: Negative.   Respiratory: Negative.  Negative for cough, choking, chest tightness, shortness of breath and stridor.   Cardiovascular: Negative.  Negative for chest pain and leg swelling.  Gastrointestinal: Positive for  abdominal pain and diarrhea. Negative for blood in stool, constipation and nausea.  Endocrine: Negative.   Genitourinary: Negative.   Allergic/Immunologic: Negative.   Neurological: Negative.  Negative for dizziness, tremors, weakness, light-headedness, numbness and headaches.  Hematological: Negative.  Negative for adenopathy. Does not bruise/bleed easily.  Psychiatric/Behavioral: Positive for sleep disturbance. Negative for behavioral problems, confusion, decreased concentration, dysphoric mood, self-injury and suicidal ideas. The patient is not nervous/anxious.     Objective:  BP (!) 152/102   Pulse (!) 102   Temp 98.4 F (36.9 C)   Resp 16   Wt 224 lb (101.6 kg)   LMP 08/08/2010   SpO2 98%   BMI 37.28 kg/m   BP Readings from Last 3 Encounters:  09/19/15 (!) 152/102  03/31/14 (!) 155/91  03/25/14 (!) 162/90    Wt Readings from Last 3 Encounters:  09/19/15 224 lb (101.6 kg)  03/31/14 232 lb 14.4 oz (105.6 kg)  03/25/14 233 lb 4 oz (105.8 kg)    Physical Exam  Constitutional: She is oriented to person, place, and time.  Non-toxic appearance. She does not have a sickly appearance. She does not appear ill. No distress.  HENT:  Mouth/Throat: Oropharynx is clear and moist. No oropharyngeal exudate.  Eyes: Conjunctivae are normal. Right eye exhibits no discharge. Left eye exhibits no discharge. No scleral icterus.  Neck: Normal range of motion. Neck supple. No JVD present. No tracheal deviation present. No thyromegaly present.  Cardiovascular: Normal rate, regular rhythm, normal heart sounds and intact distal pulses.  Exam reveals no gallop and no  friction rub.   No murmur heard. Pulmonary/Chest: Effort normal and breath sounds normal. No stridor. No respiratory distress. She has no wheezes. She has no rales. She exhibits no tenderness.  Abdominal: Soft. Bowel sounds are normal. She exhibits no distension and no mass. There is no tenderness. There is no rebound and no guarding.    Musculoskeletal: Normal range of motion. She exhibits no edema, tenderness or deformity.  Lymphadenopathy:    She has no cervical adenopathy.  Neurological: She is oriented to person, place, and time.  Skin: Skin is warm and dry. No rash noted. She is not diaphoretic. No erythema. No pallor.  Psychiatric: She has a normal mood and affect. Her behavior is normal. Judgment and thought content normal.  Vitals reviewed.   Lab Results  Component Value Date   WBC 8.4 09/19/2015   HGB 14.2 09/19/2015   HCT 41.4 09/19/2015   PLT 189.0 09/19/2015   GLUCOSE 94 09/19/2015   CHOL 261 (H) 09/19/2015   TRIG 176.0 (H) 09/19/2015   HDL 53.30 09/19/2015   LDLDIRECT 157.0 03/25/2014   LDLCALC 172 (H) 09/19/2015   ALT 28 09/19/2015   AST 26 09/19/2015   NA 141 09/19/2015   K 3.4 (L) 09/19/2015   CL 105 09/19/2015   CREATININE 0.85 09/19/2015   BUN 17 09/19/2015   CO2 28 09/19/2015   TSH 0.83 09/19/2015   INR 0.94 12/07/2010   HGBA1C 5.8 09/19/2015    Mm Digital Screening Unilat R  Result Date: 02/22/2015 CLINICAL DATA:  Screening. EXAM: DIGITAL SCREENING UNILATERAL RIGHT MAMMOGRAM WITH CAD COMPARISON:  Previous exam(s). ACR Breast Density Category b: There are scattered areas of fibroglandular density. FINDINGS: The patient has had a left mastectomy. There are no findings suspicious for malignancy. Images were processed with CAD. IMPRESSION: No mammographic evidence of malignancy. A result letter of this screening mammogram will be mailed directly to the patient. RECOMMENDATION: Screening mammogram in one year.  (Code:SM-R-39M) BI-RADS CATEGORY  1: Negative. Electronically Signed   By: Lillia Mountain M.D.   On: 02/22/2015 12:01    Assessment & Plan:   Jasmin Hernandez was seen today for annual exam, abdominal pain and hypertension.  Diagnoses and all orders for this visit:  Essential hypertension- her blood pressure is not well controlled, will restart metoprolol. Her labs today show no evidence of  end organ damage or secondary metabolic causes for hypertension. -     Discontinue: metoprolol (LOPRESSOR) 50 MG tablet; Take 0.5 tablets (25 mg total) by mouth 2 (two) times daily. -     metoprolol (LOPRESSOR) 50 MG tablet; Take 0.5 tablets (25 mg total) by mouth 2 (two) times daily.  Hyperglycemia- improvement noted -     Hemoglobin A1c; Future  Routine general medical examination at a health care facility- exam completed, labs ordered and reviewed, vaccines reviewed and updated, her Pap smear and mammogram are up-to-date, patient education material was given. -     Lipid panel; Future -     CBC with Differential/Platelet; Future -     TSH; Future -     Comprehensive metabolic panel; Future  Insomnia -     Suvorexant (BELSOMRA) 20 MG TABS; Take 1 tablet by mouth at bedtime as needed.  IBS (irritable bowel syndrome)- will try Viberzi to treat her symptoms -     Eluxadoline (VIBERZI) 75 MG TABS; Take 1 tablet by mouth 2 (two) times daily.  Hyperlipidemia with target LDL less than 130- her Framingham risk score is 14% so  I have asked her to start taking a statin for risk reduction -     rosuvastatin (CRESTOR) 20 MG tablet; Take 1 tablet (20 mg total) by mouth daily.   I have discontinued Ms. Vergara's Suvorexant and cloNIDine. I am also having her start on Suvorexant, Eluxadoline, and rosuvastatin. Additionally, I am having her maintain her tamoxifen and metoprolol.  Meds ordered this encounter  Medications  . Suvorexant (BELSOMRA) 20 MG TABS    Sig: Take 1 tablet by mouth at bedtime as needed.    Dispense:  30 tablet    Refill:  5  . Eluxadoline (VIBERZI) 75 MG TABS    Sig: Take 1 tablet by mouth 2 (two) times daily.    Dispense:  60 tablet    Refill:  5  . DISCONTD: metoprolol (LOPRESSOR) 50 MG tablet    Sig: Take 0.5 tablets (25 mg total) by mouth 2 (two) times daily.    Dispense:  60 tablet    Refill:  11  . metoprolol (LOPRESSOR) 50 MG tablet    Sig: Take 0.5 tablets (25 mg  total) by mouth 2 (two) times daily.    Dispense:  60 tablet    Refill:  11  . rosuvastatin (CRESTOR) 20 MG tablet    Sig: Take 1 tablet (20 mg total) by mouth daily.    Dispense:  90 tablet    Refill:  3     Follow-up: Return in about 4 weeks (around 10/17/2015).  Scarlette Calico, MD

## 2015-09-19 NOTE — Patient Instructions (Signed)
Irritable Bowel Syndrome, Adult Irritable bowel syndrome (IBS) is not one specific disease. It is a group of symptoms that affects the organs responsible for digestion (gastrointestinal or GI tract).  To regulate how your GI tract works, your body sends signals back and forth between your intestines and your brain. If you have IBS, there may be a problem with these signals. As a result, your GI tract does not function normally. Your intestines may become more sensitive and overreact to certain things. This is especially true when you eat certain foods or when you are under stress.  There are four types of IBS. These may be determined based on the consistency of your stool:   IBS with diarrhea.   IBS with constipation.   Mixed IBS.   Unsubtyped IBS.  It is important to know which type of IBS you have. Some treatments are more likely to be helpful for certain types of IBS.  CAUSES  The exact cause of IBS is not known. RISK FACTORS You may have a higher risk of IBS if:  You are a woman.  You are younger than 48 years old.  You have a family history of IBS.  You have mental health problems.  You have had bacterial infection of your GI tract. SIGNS AND SYMPTOMS  Symptoms of IBS vary from person to person. The main symptom is abdominal pain or discomfort. Additional symptoms usually include one or more of the following:   Diarrhea, constipation, or both.   Abdominal swelling or bloating.   Feeling full or sick after eating a small or regular-size meal.   Frequent gas.   Mucus in the stool.   A feeling of having more stool left after a bowel movement.  Symptoms tend to come and go. They may be associated with stress, psychiatric conditions, or nothing at all.  DIAGNOSIS  There is no specific test to diagnose IBS. Your health care provider will make a diagnosis based on a physical exam, medical history, and your symptoms. You may have other tests to rule out other  conditions that may be causing your symptoms. These may include:   Blood tests.   X-rays.   CT scan.  Endoscopy and colonoscopy. This is a test in which your GI tract is viewed with a long, thin, flexible tube. TREATMENT There is no cure for IBS, but treatment can help relieve symptoms. IBS treatment often includes:   Changes to your diet, such as:  Eating more fiber.  Avoiding foods that cause symptoms.  Drinking more water.  Eating regular, medium-sized portioned meals.  Medicines. These may include:  Fiber supplements if you have constipation.  Medicine to control diarrhea (antidiarrheal medicines).  Medicine to help control muscle spasms in your GI tract (antispasmodic medicines).  Medicines to help with any mental health issues, such as antidepressants or tranquilizers.  Therapy.  Talk therapy may help with anxiety, depression, or other mental health issues that can make IBS symptoms worse.  Stress reduction.  Managing your stress can help keep symptoms under control. HOME CARE INSTRUCTIONS   Take medicines only as directed by your health care provider.  Eat a healthy diet.  Avoid foods and drinks with added sugar.  Include more whole grains, fruits, and vegetables gradually into your diet. This may be especially helpful if you have IBS with constipation.  Avoid any foods and drinks that make your symptoms worse. These may include dairy products and caffeinated or carbonated drinks.  Do not eat large meals.    Drink enough fluid to keep your urine clear or pale yellow.  Exercise regularly. Ask your health care provider for recommendations of good activities for you.  Keep all follow-up visits as directed by your health care provider. This is important. SEEK MEDICAL CARE IF:   You have constant pain.  You have trouble or pain with swallowing.  You have worsening diarrhea. SEEK IMMEDIATE MEDICAL CARE IF:   You have severe and worsening abdominal  pain.   You have diarrhea and:   You have a rash, stiff neck, or severe headache.   You are irritable, sleepy, or difficult to awaken.   You are weak, dizzy, or extremely thirsty.   You have bright red blood in your stool or you have black tarry stools.   You have unusual abdominal swelling that is painful.   You vomit continuously.   You vomit blood (hematemesis).   You have both abdominal pain and a fever.    This information is not intended to replace advice given to you by your health care provider. Make sure you discuss any questions you have with your health care provider.   Document Released: 02/04/2005 Document Revised: 02/25/2014 Document Reviewed: 10/22/2013 Elsevier Interactive Patient Education 2016 [employer]  

## 2015-09-19 NOTE — Progress Notes (Signed)
Pre visit review using our clinic review tool, if applicable. No additional management support is needed unless otherwise documented below in the visit note. 

## 2015-09-20 ENCOUNTER — Telehealth: Payer: Self-pay

## 2015-09-20 NOTE — Telephone Encounter (Signed)
-----   Message from Janith Lima, MD sent at 09/19/2015  4:07 PM EDT ----- Please let her know that her blood sugars have come down, she has prediabetes but not diabetes. She does not need a medication for her blood sugar. However, her cholesterol level has gone up quite a bit. I think she is relatively high risk of having a heart attack or stroke in the next 10 years so in addition to quitting smoking and controlling her blood pressure I am asking that she start taking a cholesterol medicine. I sent this prescription to her pharmacy. I will also send her a letter with the specific details. I will recheck her cholesterol level in the next few months when she comes back for blood pressure check.

## 2015-09-20 NOTE — Assessment & Plan Note (Signed)

## 2015-09-20 NOTE — Telephone Encounter (Signed)
Left message explaining dr Ronnald Ramp note---if patient calls back with questions, can talk with Mckynzie Liwanag

## 2015-09-25 ENCOUNTER — Telehealth: Payer: Self-pay | Admitting: Internal Medicine

## 2015-09-25 ENCOUNTER — Emergency Department (HOSPITAL_COMMUNITY)
Admission: EM | Admit: 2015-09-25 | Discharge: 2015-09-25 | Disposition: A | Discharge status: Home / Self Care | Payer: Medicare Other | Attending: Dermatology | Admitting: Dermatology

## 2015-09-25 ENCOUNTER — Encounter (HOSPITAL_COMMUNITY): Payer: Self-pay | Admitting: Emergency Medicine

## 2015-09-25 DIAGNOSIS — Z859 Personal history of malignant neoplasm, unspecified: Secondary | ICD-10-CM | POA: Diagnosis not present

## 2015-09-25 DIAGNOSIS — F17210 Nicotine dependence, cigarettes, uncomplicated: Secondary | ICD-10-CM | POA: Diagnosis not present

## 2015-09-25 DIAGNOSIS — R531 Weakness: Secondary | ICD-10-CM | POA: Diagnosis not present

## 2015-09-25 DIAGNOSIS — I10 Essential (primary) hypertension: Secondary | ICD-10-CM | POA: Diagnosis not present

## 2015-09-25 DIAGNOSIS — R200 Anesthesia of skin: Secondary | ICD-10-CM | POA: Diagnosis not present

## 2015-09-25 DIAGNOSIS — Z5321 Procedure and treatment not carried out due to patient leaving prior to being seen by health care provider: Secondary | ICD-10-CM | POA: Diagnosis not present

## 2015-09-25 DIAGNOSIS — G43909 Migraine, unspecified, not intractable, without status migrainosus: Secondary | ICD-10-CM | POA: Insufficient documentation

## 2015-09-25 LAB — CBC
HEMATOCRIT: 42.4 % (ref 36.0–46.0)
Hemoglobin: 14.2 g/dL (ref 12.0–15.0)
MCH: 30.4 pg (ref 26.0–34.0)
MCHC: 33.5 g/dL (ref 30.0–36.0)
MCV: 90.8 fL (ref 78.0–100.0)
PLATELETS: 177 10*3/uL (ref 150–400)
RBC: 4.67 MIL/uL (ref 3.87–5.11)
RDW: 12.9 % (ref 11.5–15.5)
WBC: 7.5 10*3/uL (ref 4.0–10.5)

## 2015-09-25 LAB — BASIC METABOLIC PANEL
Anion gap: 7 (ref 5–15)
BUN: 14 mg/dL (ref 6–20)
CHLORIDE: 107 mmol/L (ref 101–111)
CO2: 25 mmol/L (ref 22–32)
CREATININE: 0.75 mg/dL (ref 0.44–1.00)
Calcium: 9.3 mg/dL (ref 8.9–10.3)
GFR calc Af Amer: >60 mL/min (ref >60)
GFR calc non Af Amer: >60 mL/min (ref >60)
GLUCOSE: 105 mg/dL — AB (ref 65–99)
POTASSIUM: 3.8 mmol/L (ref 3.5–5.1)
SODIUM: 139 mmol/L (ref 135–145)

## 2015-09-25 NOTE — Telephone Encounter (Signed)
PLEASE NOTE: All timestamps contained within this report are represented as [place] Standard Time. CONFIDENTIALTY NOTICE: This fax transmission is intended only for the addressee. It contains information that is legally privileged, confidential or otherwise protected from use or disclosure. If you are not the intended recipient, you are strictly prohibited from reviewing, disclosing, copying using or disseminating any of this information or taking any action in reliance on or regarding this information. If you have received this fax in error, please notify us immediately by telephone so that we can arrange for its return to [place]. Phone: [phone], Toll-Free: [phone], Fax: [phone] Page: 1 of 1 Call Id: [id number] [place] Patient Name: Jasmin Hernandez DOB: Apr 30, 1965 Initial Comment Caller states thinks getting allergic reaction to chlorestorol med; headache, tingling in both feet; lips numb left side of face; same happened in 2011 w/cholesterol med; went to ED and they checked her out but she left before they called her back a 2nd time; Nurse Assessment Nurse: Dimas Chyle, RN, Dellis Filbert Date/Time Eilene Ghazi Time): 09/25/2015 2:28:36 PM Confirm and document reason for call. If symptomatic, describe symptoms. You must click the next button to save text entered. ---Caller states thinks getting allergic reaction to chlorestorol med; headache, tingling in both feet; lips numb left side of face; same happened in 2011 w/cholesterol med; went to ED and they checked her out but she left before they called her back a 2nd time. Started medication 3-4 days ago. Rosuvastatin calcium 20 daily. Symptoms started today. Has the patient traveled out of the country within the last 30 days? ---No Does the patient have any new or worsening symptoms? ---Yes Will a triage be completed? ---Yes Related visit to physician within  the last 2 weeks? ---Yes Does the PT have any chronic conditions? (i.e. diabetes, asthma, etc.) ---Yes List chronic conditions. ---HTN, hyperlipidemia Is the patient pregnant or possibly pregnant? (Ask all females between the ages of 20-55) ---No Is this a behavioral health or substance abuse call? ---No Guidelines Guideline Title Affirmed Question Affirmed Notes Muscle Aches and Body Pain Taking a statin medicine (a lipid or cholesterol lowering drug) Final Disposition User See PCP When Office is Open (within 3 days) Dimas Chyle, RN, [employer] Referrals REFERRED TO PCP OFFICE Disagree/Comply: Leta Baptist

## 2015-09-25 NOTE — ED Triage Notes (Signed)
Pt. Stated, I notice 30 minutes ago I started having numbness on the left side of face, symmetrical smile. My toes feel numb. Pt. Alert and oriented x 3.

## 2015-09-25 NOTE — ED Notes (Signed)
Called pt X3 no answer 

## 2015-09-25 NOTE — ED Notes (Signed)
No answer x 4  

## 2015-09-27 ENCOUNTER — Ambulatory Visit: Payer: Medicare Other | Admitting: Internal Medicine

## 2015-10-02 ENCOUNTER — Telehealth: Payer: Self-pay

## 2015-10-02 NOTE — Telephone Encounter (Signed)
Request for [facility] services  Patient came in and left hand written note:    Dear Dr. Ronnald Ramp,  My name is Jasmin Hernandez and I am a patient of yours. I am writing this letter to get a referral from you for a visiting nurse to come out to help me at my home. I am really in need of these services. I had help before but my husband and I are no longer together. I really appreciate if you give me a referral for the needed services. You can send my medical records to [facility]. [address], [place] [address]. [phone]

## 2015-10-03 ENCOUNTER — Other Ambulatory Visit: Payer: Self-pay | Admitting: Internal Medicine

## 2015-10-03 DIAGNOSIS — F3162 Bipolar disorder, current episode mixed, moderate: Secondary | ICD-10-CM

## 2015-10-03 NOTE — Telephone Encounter (Signed)
I don't think she has any medical illnesses that would justify home health care

## 2015-10-05 NOTE — Telephone Encounter (Addendum)
LVM for pt to call back as soon as possible.   

## 2016-01-02 ENCOUNTER — Ambulatory Visit (INDEPENDENT_AMBULATORY_CARE_PROVIDER_SITE_OTHER)
Admission: RE | Admit: 2016-01-02 | Discharge: 2016-01-02 | Disposition: A | Discharge status: Home / Self Care | Payer: Medicare Other | Source: Ambulatory Visit | Attending: Internal Medicine | Admitting: Internal Medicine

## 2016-01-02 ENCOUNTER — Ambulatory Visit (INDEPENDENT_AMBULATORY_CARE_PROVIDER_SITE_OTHER): Payer: Medicare Other | Admitting: Internal Medicine

## 2016-01-02 ENCOUNTER — Encounter: Payer: Self-pay | Admitting: Internal Medicine

## 2016-01-02 VITALS — BP 182/110 | HR 106 | Temp 98.2°F | Resp 16 | Ht 66.0 in | Wt 234.2 lb

## 2016-01-02 DIAGNOSIS — E785 Hyperlipidemia, unspecified: Secondary | ICD-10-CM | POA: Diagnosis not present

## 2016-01-02 DIAGNOSIS — J988 Other specified respiratory disorders: Secondary | ICD-10-CM

## 2016-01-02 DIAGNOSIS — K589 Irritable bowel syndrome without diarrhea: Secondary | ICD-10-CM

## 2016-01-02 DIAGNOSIS — I10 Essential (primary) hypertension: Secondary | ICD-10-CM

## 2016-01-02 DIAGNOSIS — R059 Cough, unspecified: Secondary | ICD-10-CM

## 2016-01-02 DIAGNOSIS — R05 Cough: Secondary | ICD-10-CM | POA: Diagnosis not present

## 2016-01-02 MED ORDER — NEBIVOLOL HCL 10 MG PO TABS: 10.0000 mg | ORAL_TABLET | Freq: Every day | ORAL | 1 refills | Status: DC

## 2016-01-02 MED ORDER — AMOXICILLIN-POT CLAVULANATE 875-125 MG PO TABS: 1.0000 | ORAL_TABLET | Freq: Two times a day (BID) | ORAL | 0 refills | Status: DC

## 2016-01-02 MED ORDER — ELUXADOLINE 75 MG PO TABS: 1.0000 | ORAL_TABLET | Freq: Two times a day (BID) | ORAL | 5 refills | Status: DC

## 2016-01-02 MED ORDER — PROMETHAZINE-DM 6.25-15 MG/5ML PO SYRP: 5.0000 mL | ORAL_SOLUTION | Freq: Four times a day (QID) | ORAL | 0 refills | Status: DC | PRN

## 2016-01-02 MED ORDER — TORSEMIDE 20 MG PO TABS: 20.0000 mg | ORAL_TABLET | Freq: Every day | ORAL | 1 refills | Status: DC

## 2016-01-02 NOTE — Progress Notes (Signed)
Pre visit review using our clinic review tool, if applicable. No additional management support is needed unless otherwise documented below in the visit note. 

## 2016-01-02 NOTE — Patient Instructions (Signed)
Hypertension Hypertension, commonly called high blood pressure, is when the force of blood pumping through your arteries is too strong. Your arteries are the blood vessels that carry blood from your heart throughout your body. A blood pressure reading consists of a higher number over a lower number, such as 110/72. The higher number (systolic) is the pressure inside your arteries when your heart pumps. The lower number (diastolic) is the pressure inside your arteries when your heart relaxes. Ideally you want your blood pressure below 120/80. Hypertension forces your heart to work harder to pump blood. Your arteries may become narrow or stiff. Having untreated or uncontrolled hypertension can cause heart attack, stroke, kidney disease, and other problems. What increases the risk? Some risk factors for high blood pressure are controllable. Others are not. Risk factors you cannot control include:  Race. You may be at higher risk if you are African American.  Age. Risk increases with age.  Gender. Men are at higher risk than women before age 45 years. After age 65, women are at higher risk than men. Risk factors you can control include:  Not getting enough exercise or physical activity.  Being overweight.  Getting too much fat, sugar, calories, or salt in your diet.  Drinking too much alcohol. What are the signs or symptoms? Hypertension does not usually cause signs or symptoms. Extremely high blood pressure (hypertensive crisis) may cause headache, anxiety, shortness of breath, and nosebleed. How is this diagnosed? To check if you have hypertension, your health care provider will measure your blood pressure while you are seated, with your arm held at the level of your heart. It should be measured at least twice using the same arm. Certain conditions can cause a difference in blood pressure between your right and left arms. A blood pressure reading that is higher than normal on one occasion does  not mean that you need treatment. If it is not clear whether you have high blood pressure, you may be asked to return on a different day to have your blood pressure checked again. Or, you may be asked to monitor your blood pressure at home for 1 or more weeks. How is this treated? Treating high blood pressure includes making lifestyle changes and possibly taking medicine. Living a healthy lifestyle can help lower high blood pressure. You may need to change some of your habits. Lifestyle changes may include:  Following the DASH diet. This diet is high in fruits, vegetables, and whole grains. It is low in salt, red meat, and added sugars.  Keep your sodium intake below 2,300 mg per day.  Getting at least 30-45 minutes of aerobic exercise at least 4 times per week.  Losing weight if necessary.  Not smoking.  Limiting alcoholic beverages.  Learning ways to reduce stress. Your health care provider may prescribe medicine if lifestyle changes are not enough to get your blood pressure under control, and if one of the following is true:  You are 18-59 years of age and your systolic blood pressure is above 140.  You are 60 years of age or older, and your systolic blood pressure is above 150.  Your diastolic blood pressure is above 90.  You have diabetes, and your systolic blood pressure is over 140 or your diastolic blood pressure is over 90.  You have kidney disease and your blood pressure is above 140/90.  You have heart disease and your blood pressure is above 140/90. Your personal target blood pressure may vary depending on your medical   conditions, your age, and other factors. Follow these instructions at home:  Have your blood pressure rechecked as directed by your health care provider.  Take medicines only as directed by your health care provider. Follow the directions carefully. Blood pressure medicines must be taken as prescribed. The medicine does not work as well when you skip  doses. Skipping doses also puts you at risk for problems.  Do not smoke.  Monitor your blood pressure at home as directed by your health care provider. Contact a health care provider if:  You think you are having a reaction to medicines taken.  You have recurrent headaches or feel dizzy.  You have swelling in your ankles.  You have trouble with your vision. Get help right away if:  You develop a severe headache or confusion.  You have unusual weakness, numbness, or feel faint.  You have severe chest or abdominal pain.  You vomit repeatedly.  You have trouble breathing. This information is not intended to replace advice given to you by your health care provider. Make sure you discuss any questions you have with your health care provider. Document Released: 02/04/2005 Document Revised: 07/13/2015 Document Reviewed: 11/27/2012 Elsevier Interactive Patient Education  2017 Elsevier Inc.  

## 2016-01-02 NOTE — Progress Notes (Signed)
Subjective:  Patient ID: Jasmin Hernandez, female    DOB: 08/22/1965  Age: 50 y.o. MRN: [id number]  CC: Hypertension and Cough   HPI Jasmin Hernandez presents for a blood pressure check. She tells me her blood pressure has not been well controlled and she has not taken metoprolol in several weeks. She denies headache, chest pain, shortness of breath, blurred vision, palpitations.  She complains of cough for 3 days it is productive of thick green mucus. She denies chest pain, hemoptysis, night sweats, fever, chills.  Outpatient Medications Prior to Visit  Medication Sig Dispense Refill  . rosuvastatin (CRESTOR) 20 MG tablet Take 1 tablet (20 mg total) by mouth daily. 90 tablet 3  . Suvorexant (BELSOMRA) 20 MG TABS Take 1 tablet by mouth at bedtime as needed. 30 tablet 5  . tamoxifen (NOLVADEX) 20 MG tablet Take 1 tablet (20 mg total) by mouth daily. 90 tablet 3  . Eluxadoline (VIBERZI) 75 MG TABS Take 1 tablet by mouth 2 (two) times daily. 60 tablet 5  . metoprolol (LOPRESSOR) 50 MG tablet Take 0.5 tablets (25 mg total) by mouth 2 (two) times daily. 60 tablet 11   No facility-administered medications prior to visit.     ROS Review of Systems  Constitutional: Negative.  Negative for activity change, appetite change, chills, fatigue, fever and unexpected weight change.  HENT: Negative.  Negative for trouble swallowing and voice change.   Eyes: Negative for visual disturbance.  Respiratory: Positive for cough. Negative for chest tightness, shortness of breath, wheezing and stridor.   Cardiovascular: Negative.  Negative for chest pain and leg swelling.  Gastrointestinal: Negative.  Negative for abdominal pain, diarrhea and vomiting.  Endocrine: Negative.   Genitourinary: Negative.  Negative for difficulty urinating.  Musculoskeletal: Negative.   Skin: Negative.  Negative for rash.  Allergic/Immunologic: Negative.   Neurological: Negative.   Hematological: Negative.  Negative for  adenopathy. Does not bruise/bleed easily.  Psychiatric/Behavioral: Negative.     Objective:  BP (!) 182/110 (BP Location: Left Arm, Patient Position: Sitting, Cuff Size: Large)   Pulse (!) 106   Temp 98.2 F (36.8 C) (Oral)   Resp 16   Ht 5\' 6"  (1.676 m)   Wt 234 lb 4 oz (106.3 kg)   LMP 08/08/2010   SpO2 97%   BMI 37.81 kg/m   BP Readings from Last 3 Encounters:  01/02/16 (!) 182/110  09/25/15 156/96  09/19/15 (!) 152/102    Wt Readings from Last 3 Encounters:  01/02/16 234 lb 4 oz (106.3 kg)  09/25/15 224 lb (101.6 kg)  09/19/15 224 lb (101.6 kg)    Physical Exam  Constitutional: She is oriented to person, place, and time. No distress.  HENT:  Mouth/Throat: Oropharynx is clear and moist. No oropharyngeal exudate.  Eyes: Conjunctivae are normal. Right eye exhibits no discharge. Left eye exhibits no discharge. No scleral icterus.  Neck: Normal range of motion. Neck supple. No JVD present. No tracheal deviation present. No thyromegaly present.  Cardiovascular: Normal rate, regular rhythm, normal heart sounds and intact distal pulses.  Exam reveals no gallop and no friction rub.   No murmur heard. Pulmonary/Chest: Effort normal and breath sounds normal. No stridor. No respiratory distress. She has no wheezes. She has no rales. She exhibits no tenderness.  Abdominal: Soft. Bowel sounds are normal. She exhibits no distension and no mass. There is no tenderness. There is no rebound and no guarding.  Musculoskeletal: Normal range of motion. She exhibits no edema,  tenderness or deformity.  Lymphadenopathy:    She has no cervical adenopathy.  Neurological: She is oriented to person, place, and time.  Skin: Skin is warm and dry. No rash noted. She is not diaphoretic. No erythema. No pallor.  Vitals reviewed.   Lab Results  Component Value Date   WBC 7.5 09/25/2015   HGB 14.2 09/25/2015   HCT 42.4 09/25/2015   PLT 177 09/25/2015   GLUCOSE 105 (H) 09/25/2015   CHOL 261 (H)  09/19/2015   TRIG 176.0 (H) 09/19/2015   HDL 53.30 09/19/2015   LDLDIRECT 157.0 03/25/2014   LDLCALC 172 (H) 09/19/2015   ALT 28 09/19/2015   AST 26 09/19/2015   NA 139 09/25/2015   K 3.8 09/25/2015   CL 107 09/25/2015   CREATININE 0.75 09/25/2015   BUN 14 09/25/2015   CO2 25 09/25/2015   TSH 0.83 09/19/2015   INR 0.94 12/07/2010   HGBA1C 5.8 09/19/2015    Mm Digital Screening Unilat R  Result Date: 02/22/2015 CLINICAL DATA:  Screening. EXAM: DIGITAL SCREENING UNILATERAL RIGHT MAMMOGRAM WITH CAD COMPARISON:  Previous exam(s). ACR Breast Density Category b: There are scattered areas of fibroglandular density. FINDINGS: The patient has had a left mastectomy. There are no findings suspicious for malignancy. Images were processed with CAD. IMPRESSION: No mammographic evidence of malignancy. A result letter of this screening mammogram will be mailed directly to the patient. RECOMMENDATION: Screening mammogram in one year.  (Code:SM-R-72M) BI-RADS CATEGORY  1: Negative. Electronically Signed   By: Lillia Mountain M.D.   On: 02/22/2015 12:01    Assessment & Plan:   Jasmin Hernandez was seen today for hypertension and cough.  Diagnoses and all orders for this visit:  Essential hypertension - her blood pressure is not well controlled, I have asked her to take a beta blocker this more effective for blood pressure control and to start a loop diuretic. She tells me she is allergic to thiazide diuretics. -     nebivolol (BYSTOLIC) 10 MG tablet; Take 1 tablet (10 mg total) by mouth daily. -     torsemide (DEMADEX) 20 MG tablet; Take 1 tablet (20 mg total) by mouth daily.  Irritable bowel syndrome, unspecified type -     Eluxadoline (VIBERZI) 75 MG TABS; Take 1 tablet by mouth 2 (two) times daily.  Hyperlipidemia with target LDL less than 130  Cough- chest x-ray is negative for pneumonia or metastatic disease, will control the cough with Phenergan DM. -     promethazine-dextromethorphan (PROMETHAZINE-DM)  6.25-15 MG/5ML syrup; Take 5 mLs by mouth 4 (four) times daily as needed for cough. -     DG Chest 2 View; Future  RTI (respiratory tract infection)- I will treat for bacterial causes with Augmentin, she will take Phenergan DM as needed for the cough. -     amoxicillin-clavulanate (AUGMENTIN) 875-125 MG tablet; Take 1 tablet by mouth 2 (two) times daily. -     promethazine-dextromethorphan (PROMETHAZINE-DM) 6.25-15 MG/5ML syrup; Take 5 mLs by mouth 4 (four) times daily as needed for cough.   I have discontinued Ms. Lapp's metoprolol. I am also having her start on nebivolol, torsemide, amoxicillin-clavulanate, and promethazine-dextromethorphan. Additionally, I am having her maintain her tamoxifen, Suvorexant, rosuvastatin, and Eluxadoline.  Meds ordered this encounter  Medications  . Eluxadoline (VIBERZI) 75 MG TABS    Sig: Take 1 tablet by mouth 2 (two) times daily.    Dispense:  60 tablet    Refill:  5  . nebivolol (BYSTOLIC) 10  MG tablet    Sig: Take 1 tablet (10 mg total) by mouth daily.    Dispense:  90 tablet    Refill:  1  . torsemide (DEMADEX) 20 MG tablet    Sig: Take 1 tablet (20 mg total) by mouth daily.    Dispense:  90 tablet    Refill:  1  . amoxicillin-clavulanate (AUGMENTIN) 875-125 MG tablet    Sig: Take 1 tablet by mouth 2 (two) times daily.    Dispense:  20 tablet    Refill:  0  . promethazine-dextromethorphan (PROMETHAZINE-DM) 6.25-15 MG/5ML syrup    Sig: Take 5 mLs by mouth 4 (four) times daily as needed for cough.    Dispense:  118 mL    Refill:  0     Follow-up: Return in about 3 weeks (around 01/23/2016).  Scarlette Calico, MD

## 2016-01-03 ENCOUNTER — Telehealth: Payer: Self-pay

## 2016-01-03 NOTE — Telephone Encounter (Signed)
PA initiated via phone [phone] Ref # [id number]

## 2016-01-04 NOTE — Telephone Encounter (Signed)
PA APPROVED through 02/17/2017. Pharmacy notified via fax

## 2016-01-10 DIAGNOSIS — Z9011 Acquired absence of right breast and nipple: Secondary | ICD-10-CM | POA: Diagnosis not present

## 2016-01-10 DIAGNOSIS — C50912 Malignant neoplasm of unspecified site of left female breast: Secondary | ICD-10-CM | POA: Diagnosis not present

## 2016-01-17 DIAGNOSIS — Z9011 Acquired absence of right breast and nipple: Secondary | ICD-10-CM | POA: Diagnosis not present

## 2016-01-17 DIAGNOSIS — C50912 Malignant neoplasm of unspecified site of left female breast: Secondary | ICD-10-CM | POA: Diagnosis not present

## 2016-01-18 ENCOUNTER — Other Ambulatory Visit: Payer: Self-pay | Admitting: Oncology

## 2016-01-18 DIAGNOSIS — Z1231 Encounter for screening mammogram for malignant neoplasm of breast: Secondary | ICD-10-CM

## 2016-01-30 DIAGNOSIS — Z9011 Acquired absence of right breast and nipple: Secondary | ICD-10-CM | POA: Diagnosis not present

## 2016-01-30 DIAGNOSIS — C50912 Malignant neoplasm of unspecified site of left female breast: Secondary | ICD-10-CM | POA: Diagnosis not present

## 2016-02-21 DIAGNOSIS — Z01419 Encounter for gynecological examination (general) (routine) without abnormal findings: Secondary | ICD-10-CM | POA: Diagnosis not present

## 2016-02-26 ENCOUNTER — Ambulatory Visit
Admission: RE | Admit: 2016-02-26 | Discharge: 2016-02-26 | Disposition: A | Discharge status: Home / Self Care | Payer: Medicare Other | Source: Ambulatory Visit | Attending: Oncology | Admitting: Oncology

## 2016-02-26 DIAGNOSIS — Z1231 Encounter for screening mammogram for malignant neoplasm of breast: Secondary | ICD-10-CM

## 2016-03-04 DIAGNOSIS — N840 Polyp of corpus uteri: Secondary | ICD-10-CM | POA: Diagnosis not present

## 2016-03-04 DIAGNOSIS — Z853 Personal history of malignant neoplasm of breast: Secondary | ICD-10-CM | POA: Diagnosis not present

## 2016-03-14 DIAGNOSIS — N840 Polyp of corpus uteri: Secondary | ICD-10-CM | POA: Diagnosis not present

## 2016-03-14 DIAGNOSIS — R938 Abnormal findings on diagnostic imaging of other specified body structures: Secondary | ICD-10-CM | POA: Diagnosis not present

## 2016-03-14 DIAGNOSIS — N8500 Endometrial hyperplasia, unspecified: Secondary | ICD-10-CM | POA: Diagnosis not present

## 2016-03-14 DIAGNOSIS — Z5309 Procedure and treatment not carried out because of other contraindication: Secondary | ICD-10-CM | POA: Diagnosis not present

## 2016-03-20 NOTE — H&P (Deleted)
  The note originally documented on this encounter has been moved the the encounter in which it belongs.  

## 2016-03-20 NOTE — H&P (Signed)
Patient name Jasmin Hernandez, Jasmin Hernandez DICTATION# [id number] CSN# [id number]  [facility], MD 03/20/2016 8:46 AM

## 2016-03-20 NOTE — H&P (Signed)
[name], [name] NO.:  [id number]  MEDICAL RECORD NO.:  [id number]  LOCATION:                                 FACILITY:  PHYSICIAN:  Darlyn Chamber, M.D.   DATE OF BIRTH:  December 21, 1965  DATE OF ADMISSION:  04/08/2016 DATE OF DISCHARGE:  04/08/2016                             HISTORY & PHYSICAL   HISTORY OF PRESENT ILLNESS:  The patient is a 51 year old gravida 56, para 1 female comes in for hysteroscopy.  Past history is significant that she has had history of breast cancer.  She underwent a left mastectomy.  She has been on tamoxifen.  She is followed by Dr. Jana Hakim in 2012 because of cystic enlargement of the ovary.  She had both tubes and ovaries removed.  In 2016, she had an ultrasound that showed a thickened uterine lining.  She is having no bleeding.  We did subsequently a saline infusion ultrasound here in the office.  She had a polypoid mass of the endometrial cavity.  We attempted an office resection, however, the patient had a hard time relaxing.  We ended up having the uterine perforation and I am not sure we ever got into the endometrial cavities.  Therefore, she comes now to have hysteroscopy with D and C for further evaluation.  ALLERGIES:  She is allergic to some blood pressure medications.  MEDICATIONS:  She is on 2 blood pressure medications, medications for IBS and statin.  PAST MEDICAL HISTORY:  She has had history of breast cancer, hypertension, and hyperlipidemia.  PAST SURGICAL HISTORY:  She had a left mastectomy in 2011 and lumpectomy in the right breast.  She has had 12 vaginal deliveries.  She has had previous bilateral tubal ligation.  SOCIAL HISTORY:  Reveals no tobacco or alcohol use.  FAMILY HISTORY:  Noncontributory.  REVIEW OF SYSTEMS:  Noncontributory.  PHYSICAL EXAMINATION:  VITAL SIGNS:  The patient is afebrile.  Stable vital signs. HEENT:  The patient is normocephalic.  Pupils equal, round, reactive to light  and accommodation.  Extraocular movements were intact.  Sclerae and conjunctivae are clear.  Oropharynx is clear. BREASTS:  Not examined. LUNGS:  Clear. CARDIOVASCULAR:  Regular rate.  No murmurs or gallops.  No carotid or abdominal bruits. ABDOMEN:  Benign.  No mass, organomegaly or tenderness. GU:  On pelvic; normal external genitalia.  Vaginal cuff is clear. Cervix unremarkable.  Uterus normal size, shape, and contour.  Adnexa free of mass or tenderness. EXTREMITIES:  Trace edema. NEUROLOGIC:  Grossly within normal limits.  IMPRESSION: 1. The patient on tamoxifen with thickened endometrium.  Saline     infusion ultrasound indicates an endometrial polyp. 2. History of breast cancer. 3. Previous laparoscopic bilateral salpingo-oophorectomy. 4. Hypertension. 5. Hyperlipidemia.  PLAN:  The patient will undergo hysteroscopy with D and C.  The nature of the procedure have been discussed.  The risks have been explained including the risk of infection.  Risk of hemorrhage could require transfusion with the risk of AIDS or hepatitis.  Excessive bleeding could require hysterectomy.  Risk of injury to adjacent organs requiring perforation, could require diagnostic laparoscopy and possible exploratory surgery.  Risk of deep venous thrombosis and  pulmonary embolus.  The patient does understand the indications and risks.     Darlyn Chamber, M.D.   ______________________________ Darlyn Chamber, M.D.    JSM/MEDQ  D:  03/20/2016  T:  03/20/2016  Job:  [id number]

## 2016-03-25 ENCOUNTER — Encounter (HOSPITAL_BASED_OUTPATIENT_CLINIC_OR_DEPARTMENT_OTHER): Payer: Self-pay | Admitting: *Deleted

## 2016-03-28 ENCOUNTER — Other Ambulatory Visit: Payer: Self-pay | Admitting: *Deleted

## 2016-03-28 ENCOUNTER — Other Ambulatory Visit: Payer: Self-pay | Admitting: Internal Medicine

## 2016-03-28 DIAGNOSIS — I10 Essential (primary) hypertension: Secondary | ICD-10-CM

## 2016-03-28 MED ORDER — NEBIVOLOL HCL 10 MG PO TABS: 10.0000 mg | ORAL_TABLET | Freq: Every day | ORAL | 0 refills | Status: DC

## 2016-04-01 ENCOUNTER — Other Ambulatory Visit: Payer: Self-pay | Admitting: Internal Medicine

## 2016-04-01 DIAGNOSIS — K589 Irritable bowel syndrome without diarrhea: Secondary | ICD-10-CM

## 2016-04-01 NOTE — Telephone Encounter (Signed)
Faxed script back to [facility].../lmb 

## 2016-04-02 ENCOUNTER — Encounter (HOSPITAL_BASED_OUTPATIENT_CLINIC_OR_DEPARTMENT_OTHER): Payer: Self-pay | Admitting: *Deleted

## 2016-04-02 NOTE — Progress Notes (Signed)
NPO AFTER MN.  ARRIVE AT 0600.  CURRENT EKG.  GETTING LAB WORK DONE Friday, 04-05-2016 (CBC , BMET).  WILL TAKE AM MEDS W/ SIPS OF WATER.

## 2016-04-05 DIAGNOSIS — N840 Polyp of corpus uteri: Secondary | ICD-10-CM | POA: Diagnosis not present

## 2016-04-05 LAB — BASIC METABOLIC PANEL
ANION GAP: 5 (ref 5–15)
BUN: 13 mg/dL (ref 6–20)
CALCIUM: 9.3 mg/dL (ref 8.9–10.3)
CO2: 26 mmol/L (ref 22–32)
Chloride: 109 mmol/L (ref 101–111)
Creatinine, Ser: 0.94 mg/dL (ref 0.44–1.00)
GFR calc Af Amer: >60 mL/min (ref >60)
GFR calc non Af Amer: >60 mL/min (ref >60)
Glucose, Bld: 111 mg/dL — ABNORMAL HIGH (ref 65–99)
POTASSIUM: 4.2 mmol/L (ref 3.5–5.1)
Sodium: 140 mmol/L (ref 135–145)

## 2016-04-05 LAB — CBC
HEMATOCRIT: 38.4 % (ref 36.0–46.0)
HEMOGLOBIN: 13.2 g/dL (ref 12.0–15.0)
MCH: 29.9 pg (ref 26.0–34.0)
MCHC: 34.4 g/dL (ref 30.0–36.0)
MCV: 86.9 fL (ref 78.0–100.0)
Platelets: 190 10*3/uL (ref 150–400)
RBC: 4.42 MIL/uL (ref 3.87–5.11)
RDW: 13.5 % (ref 11.5–15.5)
WBC: 8.1 10*3/uL (ref 4.0–10.5)

## 2016-04-08 ENCOUNTER — Ambulatory Visit (HOSPITAL_BASED_OUTPATIENT_CLINIC_OR_DEPARTMENT_OTHER)
Admission: RE | Admit: 2016-04-08 | Discharge: 2016-04-08 | Disposition: A | Discharge status: Home / Self Care | Payer: Medicare Other | Source: Ambulatory Visit | Attending: Obstetrics and Gynecology | Admitting: Obstetrics and Gynecology

## 2016-04-08 ENCOUNTER — Ambulatory Visit (HOSPITAL_BASED_OUTPATIENT_CLINIC_OR_DEPARTMENT_OTHER): Payer: Medicare Other | Admitting: Anesthesiology

## 2016-04-08 ENCOUNTER — Encounter (HOSPITAL_BASED_OUTPATIENT_CLINIC_OR_DEPARTMENT_OTHER)
Admission: RE | Disposition: A | Discharge status: Home / Self Care | Payer: Self-pay | Source: Ambulatory Visit | Attending: Obstetrics and Gynecology

## 2016-04-08 ENCOUNTER — Encounter (HOSPITAL_BASED_OUTPATIENT_CLINIC_OR_DEPARTMENT_OTHER): Payer: Self-pay

## 2016-04-08 DIAGNOSIS — R05 Cough: Secondary | ICD-10-CM | POA: Diagnosis not present

## 2016-04-08 DIAGNOSIS — N840 Polyp of corpus uteri: Secondary | ICD-10-CM | POA: Diagnosis not present

## 2016-04-08 DIAGNOSIS — I10 Essential (primary) hypertension: Secondary | ICD-10-CM | POA: Diagnosis not present

## 2016-04-08 HISTORY — DX: Polyp of corpus uteri: N84.0

## 2016-04-08 HISTORY — DX: Bipolar disorder, current episode mixed, moderate: F31.62

## 2016-04-08 HISTORY — DX: Personal history of malignant neoplasm of breast: Z85.3

## 2016-04-08 HISTORY — DX: Irritable bowel syndrome, unspecified: K58.9

## 2016-04-08 HISTORY — DX: Personal history of other diseases of the female genital tract: Z87.42

## 2016-04-08 HISTORY — PX: DILATATION & CURETTAGE/HYSTEROSCOPY WITH MYOSURE: SHX6511

## 2016-04-08 SURGERY — DILATATION & CURETTAGE/HYSTEROSCOPY WITH MYOSURE
Anesthesia: General | Site: Vagina

## 2016-04-08 MED ORDER — KETOROLAC TROMETHAMINE 30 MG/ML IJ SOLN
INTRAMUSCULAR | Status: DC | PRN
  Administered 2016-04-08: 30 mg via INTRAVENOUS

## 2016-04-08 MED ORDER — DEXAMETHASONE SODIUM PHOSPHATE 10 MG/ML IJ SOLN
INTRAMUSCULAR | Status: AC
  Filled 2016-04-08: qty 1

## 2016-04-08 MED ORDER — LIDOCAINE 2% (20 MG/ML) 5 ML SYRINGE
INTRAMUSCULAR | Status: AC
  Filled 2016-04-08: qty 5

## 2016-04-08 MED ORDER — ONDANSETRON HCL 4 MG/2ML IJ SOLN
INTRAMUSCULAR | Status: DC | PRN
  Administered 2016-04-08: 4 mg via INTRAVENOUS

## 2016-04-08 MED ORDER — FENTANYL CITRATE (PF) 100 MCG/2ML IJ SOLN
INTRAMUSCULAR | Status: AC
  Filled 2016-04-08: qty 2

## 2016-04-08 MED ORDER — LIDOCAINE HCL 1 % IJ SOLN
INTRAMUSCULAR | Status: DC | PRN
  Administered 2016-04-08: 19 mL

## 2016-04-08 MED ORDER — LIDOCAINE 2% (20 MG/ML) 5 ML SYRINGE
INTRAMUSCULAR | Status: DC | PRN
  Administered 2016-04-08: 100 mg via INTRAVENOUS

## 2016-04-08 MED ORDER — FENTANYL CITRATE (PF) 100 MCG/2ML IJ SOLN
INTRAMUSCULAR | Status: DC | PRN
  Administered 2016-04-08 (×2): 50 ug via INTRAVENOUS

## 2016-04-08 MED ORDER — ONDANSETRON HCL 4 MG/2ML IJ SOLN
INTRAMUSCULAR | Status: AC
  Filled 2016-04-08: qty 2

## 2016-04-08 MED ORDER — FENTANYL CITRATE (PF) 100 MCG/2ML IJ SOLN
25.0000 ug | INTRAMUSCULAR | Status: DC | PRN
  Administered 2016-04-08: 50 ug via INTRAVENOUS
  Filled 2016-04-08: qty 1

## 2016-04-08 MED ORDER — ARTIFICIAL TEARS OP OINT
TOPICAL_OINTMENT | OPHTHALMIC | Status: AC
  Filled 2016-04-08: qty 3.5

## 2016-04-08 MED ORDER — CEFAZOLIN SODIUM-DEXTROSE 2-4 GM/100ML-% IV SOLN
2.0000 g | INTRAVENOUS | Status: AC
  Administered 2016-04-08: 2 g via INTRAVENOUS
  Filled 2016-04-08: qty 100

## 2016-04-08 MED ORDER — PROMETHAZINE HCL 25 MG/ML IJ SOLN
6.2500 mg | INTRAMUSCULAR | Status: DC | PRN
  Filled 2016-04-08: qty 1

## 2016-04-08 MED ORDER — PROPOFOL 10 MG/ML IV BOLUS
INTRAVENOUS | Status: AC
  Filled 2016-04-08: qty 40

## 2016-04-08 MED ORDER — MIDAZOLAM HCL 5 MG/5ML IJ SOLN
INTRAMUSCULAR | Status: DC | PRN
  Administered 2016-04-08: 2 mg via INTRAVENOUS

## 2016-04-08 MED ORDER — OXYCODONE-ACETAMINOPHEN 7.5-325 MG PO TABS: 1.0000 | ORAL_TABLET | ORAL | 0 refills | Status: DC | PRN

## 2016-04-08 MED ORDER — LACTATED RINGERS IV SOLN
INTRAVENOUS | Status: DC
  Administered 2016-04-08 (×2): via INTRAVENOUS
  Filled 2016-04-08: qty 1000

## 2016-04-08 MED ORDER — KETOROLAC TROMETHAMINE 30 MG/ML IJ SOLN
INTRAMUSCULAR | Status: AC
  Filled 2016-04-08: qty 1

## 2016-04-08 MED ORDER — DEXAMETHASONE SODIUM PHOSPHATE 4 MG/ML IJ SOLN
INTRAMUSCULAR | Status: DC | PRN
  Administered 2016-04-08: 10 mg via INTRAVENOUS

## 2016-04-08 MED ORDER — MIDAZOLAM HCL 2 MG/2ML IJ SOLN
INTRAMUSCULAR | Status: AC
  Filled 2016-04-08: qty 2

## 2016-04-08 MED ORDER — CEFAZOLIN SODIUM-DEXTROSE 2-4 GM/100ML-% IV SOLN
INTRAVENOUS | Status: AC
  Filled 2016-04-08: qty 100

## 2016-04-08 MED ORDER — PROPOFOL 10 MG/ML IV BOLUS
INTRAVENOUS | Status: DC | PRN
  Administered 2016-04-08: 200 mg via INTRAVENOUS

## 2016-04-08 SURGICAL SUPPLY — 33 items
CANISTER SUCTION 2500CC (MISCELLANEOUS) ×3 IMPLANT
CATH ROBINSON RED A/P 16FR (CATHETERS) ×3 IMPLANT
COVER BACK TABLE 60X90IN (DRAPES) ×3 IMPLANT
DEVICE MYOSURE LITE (MISCELLANEOUS) ×3 IMPLANT
DEVICE MYOSURE REACH (MISCELLANEOUS) IMPLANT
DILATOR CANAL MILEX (MISCELLANEOUS) IMPLANT
DRAPE LG THREE QUARTER DISP (DRAPES) ×3 IMPLANT
DRSG TELFA 3X8 NADH (GAUZE/BANDAGES/DRESSINGS) ×3 IMPLANT
ELECT REM PT RETURN 9FT ADLT (ELECTROSURGICAL)
ELECTRODE REM PT RTRN 9FT ADLT (ELECTROSURGICAL) IMPLANT
FILTER ARTHROSCOPY CONVERTOR (FILTER) ×3 IMPLANT
GLOVE BIO SURGEON STRL SZ 6.5 (GLOVE) ×2 IMPLANT
GLOVE BIO SURGEON STRL SZ7 (GLOVE) ×6 IMPLANT
GLOVE BIO SURGEONS STRL SZ 6.5 (GLOVE) ×1
GLOVE BIOGEL PI IND STRL 6.5 (GLOVE) ×2 IMPLANT
GLOVE BIOGEL PI INDICATOR 6.5 (GLOVE) ×4
GOWN STRL REUS W/ TWL LRG LVL3 (GOWN DISPOSABLE) ×2 IMPLANT
GOWN STRL REUS W/TWL LRG LVL3 (GOWN DISPOSABLE) ×7 IMPLANT
IV NS IRRIG 3000ML ARTHROMATIC (IV SOLUTION) ×3 IMPLANT
KIT RM TURNOVER CYSTO AR (KITS) ×3 IMPLANT
LEGGING LITHOTOMY PAIR STRL (DRAPES) ×3 IMPLANT
NEEDLE SPNL 18GX3.5 QUINCKE PK (NEEDLE) ×3 IMPLANT
PACK BASIN DAY SURGERY FS (CUSTOM PROCEDURE TRAY) ×3 IMPLANT
PAD OB MATERNITY 4.3X12.25 (PERSONAL CARE ITEMS) ×3 IMPLANT
PAD PREP 24X48 CUFFED NSTRL (MISCELLANEOUS) ×3 IMPLANT
SEAL ROD LENS SCOPE MYOSURE (ABLATOR) ×3 IMPLANT
SYR CONTROL 10ML LL (SYRINGE) ×3 IMPLANT
TOWEL OR 17X24 6PK STRL BLUE (TOWEL DISPOSABLE) ×6 IMPLANT
TUBE CONNECTING 12'X1/4 (SUCTIONS)
TUBE CONNECTING 12X1/4 (SUCTIONS) IMPLANT
TUBING AQUILEX INFLOW (TUBING) ×3 IMPLANT
TUBING AQUILEX OUTFLOW (TUBING) ×3 IMPLANT
WATER STERILE IRR 500ML POUR (IV SOLUTION) ×3 IMPLANT

## 2016-04-08 NOTE — Anesthesia Procedure Notes (Signed)
Procedure Name: LMA Insertion Date/Time: 04/08/2016 7:35 AM Performed by: Wanita Chamberlain Pre-anesthesia Checklist: Patient identified, Timeout performed, Emergency Drugs available, Suction available and Patient being monitored Patient Re-evaluated:Patient Re-evaluated prior to inductionOxygen Delivery Method: Circle system utilized Preoxygenation: Pre-oxygenation with 100% oxygen Intubation Type: IV induction Ventilation: Mask ventilation without difficulty LMA: LMA inserted LMA Size: 4.0 Number of attempts: 1 Placement Confirmation: breath sounds checked- equal and bilateral and positive ETCO2 Tube secured with: Tape Dental Injury: Teeth and Oropharynx as per pre-operative assessment

## 2016-04-08 NOTE — Transfer of Care (Signed)
Immediate Anesthesia Transfer of Care Note  Patient: Jasmin Hernandez  Procedure(s) Performed: Procedure(s): DILATATION & CURETTAGE/HYSTEROSCOPY WITH MYOSURE (N/A)  Patient Location: PACU  Anesthesia Type:General  Level of Consciousness: awake, alert , oriented and patient cooperative  Airway & Oxygen Therapy: Patient Spontanous Breathing and Patient connected to nasal cannula oxygen  Post-op Assessment: Report given to RN and Post -op Vital signs reviewed and stable  Post vital signs: Reviewed and stable  Last Vitals:  Vitals:   04/08/16 0619  BP: (!) 136/93  Pulse: 63  Resp: 18  Temp: 37.1 C    Last Pain:  Vitals:   04/08/16 0619  TempSrc: Oral      Patients Stated Pain Goal: 7 ([id number])  Complications: No apparent anesthesia complications

## 2016-04-08 NOTE — Op Note (Signed)
NAMEWAJEEHA, ALSBURY NO.:  [id number]  MEDICAL RECORD NO.:  [id number]  LOCATION:                                 FACILITY:  PHYSICIAN:  Darlyn Chamber, M.D.        DATE OF BIRTH:  DATE OF PROCEDURE:  04/08/2016 DATE OF DISCHARGE:                              OPERATIVE REPORT   PREOPERATIVE DIAGNOSIS:  Endometrial polyp.  POSTOPERATIVE DIAGNOSIS:  Endometrial polyp.  OPERATION: 1. Paracervical block. 2. Cervical dilatation. 3. Hysteroscopy with MyoSure resection of endometrial polyp and     buildup.  Subsequent endometrial curetting.  SURGEON:  Darlyn Chamber, M.D.  ANESTHESIA:  Paracervical block and general.  ESTIMATED BLOOD LOSS:  Minimal.  PACKS AND DRAINS:  None.  INTRAOPERATIVE BLOOD PLACEMENT:  None.  COMPLICATIONS:  None.  INDICATION:  Dictated in history and physical.  PROCEDURE:  The patient was taken to the OR and placed in supine position.  After satisfactory level of general anesthesia was obtained, the patient was placed in dorsal lithotomy position.  This was done using the [place].  The perineum and vagina were prepped out with Betadine and draped with sterile field.  The speculum was placed in the vaginal vault.  Cervix was grasped with single-tooth tenaculum. Paracervical block with 1% Xylocaine was instituted.  The uterus sounded to approximately 9 cm.  Cervix was sterilely dilated to a size #23 Pratt dilator.  Hysteroscope was introduced, intrauterine cavity was distended using saline.  On visualization, she had some polyp-like outgrowth from the posterior wall of the uterus.  We brought in the MyoSure, all these were resected along with the rest of the endometrial lining.  This was all sent for pathological view.  Total deficit at this point was 400 mL, but there was quite a bit of fluid on the floor.  There was no signs of perforation, no active bleeding.  The hysteroscope was then removed and endometrial curettings  were also obtained, and sent for pathological review.  Sponge, instrument, and needle count were reported as correct by circulating nurse.  The patient tolerated the procedure well.  Once extubated, transferred to the recovery room in good condition.     Darlyn Chamber, M.D.     JSM/MEDQ  D:  04/08/2016  T:  04/08/2016  Job:  [id number]

## 2016-04-08 NOTE — Brief Op Note (Signed)
04/08/2016  7:59 AM  PATIENT:  Jasmin Hernandez  51 y.o. female  PRE-OPERATIVE DIAGNOSIS:  POLYP  POST-OPERATIVE DIAGNOSIS:  POLYP  PROCEDURE:  Procedure(s): DILATATION & CURETTAGE/HYSTEROSCOPY WITH MYOSURE (N/A)  SURGEON:  Surgeon(s) and Role:    * Arvella Nigh, MD - Primary  PHYSICIAN ASSISTANT:   ASSISTANTS: none   ANESTHESIA:   general and paracervical block  EBL:  Total I/O In: -  Out: 25 [Blood:25]  BLOOD ADMINISTERED:none  DRAINS: none   LOCAL MEDICATIONS USED:  XYLOCAINE   SPECIMEN:  Source of Specimen:  endometrilal resections and currettings  DISPOSITION OF SPECIMEN:  PATHOLOGY  COUNTS:  YES  TOURNIQUET:  * No tourniquets in log *  DICTATION: .Other Dictation: Dictation Number [id number]  PLAN OF CARE: Discharge to home after PACU  PATIENT DISPOSITION:  PACU - hemodynamically stable.   Delay start of Pharmacological VTE agent (>24hrs) due to surgical blood loss or risk of bleeding: not applicable

## 2016-04-08 NOTE — Anesthesia Postprocedure Evaluation (Addendum)
Anesthesia Post Note  Patient: Jasmin Hernandez  Procedure(s) Performed: Procedure(s) (LRB): DILATATION & CURETTAGE/HYSTEROSCOPY WITH MYOSURE (N/A)  Patient location during evaluation: PACU Anesthesia Type: General Level of consciousness: awake and alert Pain management: pain level controlled Vital Signs Assessment: post-procedure vital signs reviewed and stable Respiratory status: spontaneous breathing, nonlabored ventilation, respiratory function stable and patient connected to nasal cannula oxygen Cardiovascular status: blood pressure returned to baseline and stable Postop Assessment: no signs of nausea or vomiting Anesthetic complications: no       Last Vitals:  Vitals:   04/08/16 0811 04/08/16 0815  BP: (!) 159/76   Pulse: 82 80  Resp: 16 18  Temp:  36.4 C    Last Pain:  Vitals:   04/08/16 0811  TempSrc:   PainSc: 6                  Chelsea Pedretti S

## 2016-04-08 NOTE — Anesthesia Preprocedure Evaluation (Addendum)
Anesthesia Evaluation  Patient identified by MRN, date of birth, ID band Patient awake    Reviewed: Allergy & Precautions, NPO status , Patient's Chart, lab work & pertinent test results  Airway Mallampati: II  TM Distance: <3 FB Neck ROM: Full    Dental no notable dental hx. (+) Teeth Intact, Dental Advisory Given   Pulmonary Current Smoker,    Pulmonary exam normal breath sounds clear to auscultation       Cardiovascular Exercise Tolerance: Good hypertension, Pt. on medications Normal cardiovascular exam Rhythm:Regular Rate:Normal     Neuro/Psych Anxiety Depression Bipolar Disorder negative neurological ROS     GI/Hepatic negative GI ROS, Neg liver ROS,   Endo/Other  Morbid obesity  Renal/GU negative Renal ROS  negative genitourinary   Musculoskeletal negative musculoskeletal ROS (+)   Abdominal   Peds negative pediatric ROS (+)  Hematology negative hematology ROS (+)   Anesthesia Other Findings   Reproductive/Obstetrics negative OB ROS                           Anesthesia Physical Anesthesia Plan  ASA: III  Anesthesia Plan: General   Post-op Pain Management:    Induction: Intravenous  Airway Management Planned: LMA  Additional Equipment:   Intra-op Plan:   Post-operative Plan: Extubation in OR  Informed Consent: I have reviewed the patients History and Physical, chart, labs and discussed the procedure including the risks, benefits and alternatives for the proposed anesthesia with the patient or authorized representative who has indicated his/her understanding and acceptance.   Dental advisory given  Plan Discussed with: CRNA and Surgeon  Anesthesia Plan Comments:         Anesthesia Quick Evaluation

## 2016-04-08 NOTE — H&P (Signed)
  History and physical exam unchanged 

## 2016-04-08 NOTE — Discharge Instructions (Signed)

## 2016-04-09 ENCOUNTER — Encounter (HOSPITAL_BASED_OUTPATIENT_CLINIC_OR_DEPARTMENT_OTHER): Payer: Self-pay | Admitting: Obstetrics and Gynecology

## 2016-04-18 DIAGNOSIS — Z09 Encounter for follow-up examination after completed treatment for conditions other than malignant neoplasm: Secondary | ICD-10-CM | POA: Diagnosis not present

## 2016-06-11 ENCOUNTER — Encounter (HOSPITAL_COMMUNITY): Payer: Self-pay | Admitting: Emergency Medicine

## 2016-06-11 ENCOUNTER — Emergency Department (HOSPITAL_COMMUNITY)
Admission: EM | Admit: 2016-06-11 | Discharge: 2016-06-11 | Disposition: A | Discharge status: Home / Self Care | Payer: Medicare Other | Attending: Emergency Medicine | Admitting: Emergency Medicine

## 2016-06-11 DIAGNOSIS — Z853 Personal history of malignant neoplasm of breast: Secondary | ICD-10-CM | POA: Insufficient documentation

## 2016-06-11 DIAGNOSIS — R42 Dizziness and giddiness: Secondary | ICD-10-CM | POA: Diagnosis not present

## 2016-06-11 DIAGNOSIS — Z79899 Other long term (current) drug therapy: Secondary | ICD-10-CM | POA: Insufficient documentation

## 2016-06-11 DIAGNOSIS — I10 Essential (primary) hypertension: Secondary | ICD-10-CM | POA: Diagnosis not present

## 2016-06-11 DIAGNOSIS — R030 Elevated blood-pressure reading, without diagnosis of hypertension: Secondary | ICD-10-CM | POA: Diagnosis not present

## 2016-06-11 DIAGNOSIS — F17210 Nicotine dependence, cigarettes, uncomplicated: Secondary | ICD-10-CM | POA: Insufficient documentation

## 2016-06-11 DIAGNOSIS — G4489 Other headache syndrome: Secondary | ICD-10-CM | POA: Diagnosis not present

## 2016-06-11 NOTE — ED Provider Notes (Signed)
Patient states she was bending over picking up laundry this morning and became lightheaded for 1 minute symptoms resolved spontaneously without treatment. She took her blood pressure at the time and noted to be 989 systolic. She denies any headache denies chest pain no other associated symptoms.  asymptomatic on exam alert no distress. Glasgow Coma Score 15 lungs clear auscultation heart regular rate and rhythm abdomen nondistended neurologic gait normal Romberg normal pronator drift normal finger to nose normal. I counseled patient for 5 minutes on smoking cessation. We've advised her to get her blood pressure recheck within the next 3 weeks at her primary care physicians and asked her primary care physician to help her stop smoking   Orlie Dakin, MD 06/11/16 1054

## 2016-06-11 NOTE — ED Triage Notes (Signed)
Pt arrives by [facility] from home pt states she was washing clothes and felt dizzy so she took her blood pressure and it was elevated so she called ems. Pt has no dizziness now she a mild aching headache.

## 2016-06-11 NOTE — ED Notes (Signed)
Papers reviewed with patient along with Dr. Lenna Sciara. No more questions. Crackers and sprite given!

## 2016-06-11 NOTE — Discharge Instructions (Signed)
Please return to the Emergency Department for new or worsening symptoms.

## 2016-06-11 NOTE — ED Provider Notes (Signed)
[facility] DEPT Provider Note   CSN: [id number] Arrival date & time: 06/11/16  0901     History   Chief Complaint Chief Complaint  Patient presents with  . Hypertension    HPI Jasmin Hernandez is a 51 y.o. female with a h/o of HTN, HLD, and left breast CA s/p mastectomy who presents to the emergency department by EMS for dizziness. She reports that she was doing laundry at her home this morning when suddenly felt dizzy while she leaned over to pick up a basket of clothes. She reports that she used her blood pressure cuff at home and obtained a reading of 178/100. She rechecked her blood pressure and obtained a reading of 181/100 and became concerned so she called EMS because she was concerned she was having a stroke due to her high blood pressure. She also reports an associated posterior headache that radiates to posterior neck, which she treated at home with 400 mg of ibuprofen. No other associated symptoms including chest pain, shortness of breath, visual disturbances, dysuria, or abdominal pain. No recent falls or trauma. No other recent episodes of dizziness. She denies URI symptoms, otalgia, fever, or  chills. She reports that she was given blood pressure lowering agents at an [facility] ED last year after she forgot her medication in [place] while she was out of town visiting her boyfriend; however, she was not admitted for observation. No previous hospitalizations for hypertension. She reports that she has been under an increased amount of stress lately from her children. She reports that she has taken her BP medication this morning, but has not eaten yet today.   PCP is Dr. Ronnald Ramp at [facility]. Past medical history includes hypertension, hyperlipidemia, and left breast cancer s/p mastectomy in 2012. Daily medications include 10 mg of nebilvolol and 20 mg of rosuvastatin. Her last appointment was December 2017. Allergies include HCTZ. She reports that she does not regularly check her blood  pressure at home. She is a one ppd cigarette smoker..  The history is provided by the patient. No language interpreter was used.    Past Medical History:  Diagnosis Date  . Anxiety   . Bipolar 1 disorder, mixed, moderate (HCC)    per pt's pcp note's  . Depression   . Endometrial polyp   . 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 History of ovarian cyst    right side  s/p  bso 12-07-2010  . Hypertension   . IBS (irritable bowel syndrome)     Patient Active Problem List   Diagnosis Date Noted  . Endometrial polyp 04/08/2016    Class: Present on Admission  . Cough 01/02/2016  . RTI (respiratory tract infection) 01/02/2016  . IBS (irritable bowel syndrome) 09/19/2015  . Hyperlipidemia with target LDL less than 130 09/19/2015  . Marijuana abuse 03/28/2014  . Bipolar 1 disorder, mixed, moderate ([place]) 03/25/2014  . Routine general medical examination at a health care facility 12/21/2012  . Hyperglycemia 12/21/2012  . Insomnia 12/21/2012  . Malignant neoplasm of female breast ([place]) 11/07/2009  . OBESITY 10/04/2009  . TOBACCO USE 10/04/2009  . Essential hypertension 10/04/2009    Past Surgical History:  Procedure Laterality Date  . BREAST BIOPSY  01/16/2011   Procedure: BREAST BIOPSY;  Surgeon: Imogene Burn. Georgette Dover, MD;  Location: [facility];  Service: General;  Laterality: Right;   . DILATATION & CURETTAGE/HYSTEROSCOPY WITH MYOSURE N/A 04/08/2016   Procedure: DILATATION & CURETTAGE/HYSTEROSCOPY WITH MYOSURE;  Surgeon: Arvella Nigh, MD;  Location: [facility];  Service: Gynecology;  Laterality: N/A;  . LAPAROSCOPIC TUBAL LIGATION Bilateral 06/28/2010   w/ Fulgeration  . LAPAROSCOPY  12/07/2010   Procedure: LAPAROSCOPY OPERATIVE;  Surgeon: Gus Height;  Location: [facility];  Service: Gynecology;  Laterality: N/A;  . MASTECTOMY  W/ SENTINEL NODE BIOPSY Left 12/18/2009  . SALPINGOOPHORECTOMY  12/07/2010   Procedure: SALPINGO OOPHERECTOMY;  Surgeon: Gus Height;  Location: [facility];  Service: Gynecology;  Laterality: Bilateral;    OB History    No data available       Home Medications    Prior to Admission medications   Medication Sig Start Date End Date Taking? Authorizing Provider  BYSTOLIC 10 MG tablet TAKE 1 TABLET BY MOUTH ONCE DAILY Patient taking differently: TAKE 1 TABLET BY MOUTH ONCE DAILY---  TAKES IN AM 03/28/16  Yes Janith Lima, MD  ibuprofen (ADVIL,MOTRIN) 200 MG tablet Take 400 mg by mouth every 6 (six) hours as needed for headache.   Yes Historical Provider, MD  rosuvastatin (CRESTOR) 20 MG tablet Take 1 tablet (20 mg total) by mouth daily. Patient taking differently: Take 20 mg by mouth every morning.  09/19/15  Yes Janith Lima, MD  oxyCODONE-acetaminophen (PERCOCET) 7.5-325 MG tablet Take 1 tablet by mouth every 4 (four) hours as needed for severe pain. Patient not taking: Reported on 06/11/2016 04/08/16   Arvella Nigh, MD    Family History Family History  Problem Relation Age of Onset  . Cancer Mother   . Cancer Sister   . Mental retardation Other   . Hypertension Other   . Drug abuse Other   . Diabetes Other   . Arthritis Other   . Alcohol abuse Other   . Cancer Other     ovarian and uterine    Social History Social History  Substance Use Topics  . Smoking status: Current Every Day Smoker    Packs/day: 0.50    Years: 30.00    Types: Cigarettes  . Smokeless tobacco: Never Used  . Alcohol use No     Comment: occasional     Allergies   Hydrochlorothiazide   Review of Systems Review of Systems  Constitutional: Negative for chills and fever.  Respiratory: Negative for shortness of breath.   Cardiovascular: Negative for chest pain.  Gastrointestinal: Negative for abdominal pain, diarrhea, nausea and vomiting.  Musculoskeletal: Negative for back pain.  Skin: Negative for  wound.  Neurological: Positive for dizziness and headaches. Negative for syncope and light-headedness.  Psychiatric/Behavioral: Negative for confusion.     Physical Exam Updated Vital Signs BP (!) 161/98   Pulse (!) 58   Temp 98 F (36.7 C) (Oral)   Resp 16   LMP 08/21/2010   SpO2 99%   Physical Exam  Constitutional: She is oriented to person, place, and time. She appears well-developed and well-nourished. No distress.  HENT:  Head: Normocephalic and atraumatic.  Right Ear: Tympanic membrane normal.  Left Ear: Tympanic membrane normal.  Nose: Nose normal.  Mouth/Throat: Uvula is midline and oropharynx is clear and moist.  Eyes: Conjunctivae, EOM and lids are normal. Pupils are equal, round, and reactive to light. Right conjunctiva is not injected. Left conjunctiva is not injected.  No nystagmus.   Neck: Neck supple.  Cardiovascular: Normal rate and regular rhythm.  Exam reveals no  gallop and no friction rub.   No murmur heard. Pulmonary/Chest: Effort normal and breath sounds normal. No respiratory distress. She has no wheezes. She has no rales.  Abdominal: Soft. She exhibits no distension. There is no tenderness. There is no guarding.  Musculoskeletal: She exhibits no edema.  Neurological: She is alert and oriented to person, place, and time. She has normal strength. No cranial nerve deficit or sensory deficit. Coordination and gait normal. GCS eye subscore is 4. GCS verbal subscore is 5. GCS motor subscore is 6.  CN II-XII intact. No focal deficits.   Skin: Skin is warm and dry. No rash noted. She is not diaphoretic.  Psychiatric: Her behavior is normal.  Nursing note and vitals reviewed.    ED Treatments / Results  Labs (all labs ordered are listed, but only abnormal results are displayed) Labs Reviewed - No data to display  EKG  EKG Interpretation None       Radiology No results found.  Procedures Procedures (including critical care time)  Medications  Ordered in ED Medications - No data to display   Initial Impression / Assessment and Plan / ED Course  I have reviewed the triage vital signs and the nursing notes.  Pertinent labs & imaging results that were available during my care of the patient were reviewed by me and considered in my medical decision making (see chart for details).     51 year old female with acute episode of dizziness with associated HA and elevated blood pressure reading PTA. VSS and BP improving in the ED- 638-937D systolic and 42A diastolic. No dizziness in the ED. Mild aching headache, which is improving without treatment. After obtaining history and physical exam, the patient appears stable for discharge. No further workup needed at this time. Discussed the patient with Dr. Winfred Leeds, attending physician. Will discharge the patient to home and have her follow up with her PCP for a blood pressure re-check within the next 3 weeks.   Final Clinical Impressions(s) / ED Diagnoses   Final diagnoses:  Dizziness  Essential hypertension    New Prescriptions Discharge Medication List as of 06/11/2016 10:46 AM       Jessic Standifer A Bisma Klett, PA-C 06/13/16 [place], MD 06/14/16 1815

## 2016-07-22 NOTE — Addendum Note (Signed)
Addendum  created 07/22/16 1118 by Myrtie Soman, MD   Sign clinical note

## 2016-07-23 ENCOUNTER — Other Ambulatory Visit (INDEPENDENT_AMBULATORY_CARE_PROVIDER_SITE_OTHER): Payer: Medicare Other

## 2016-07-23 ENCOUNTER — Ambulatory Visit (INDEPENDENT_AMBULATORY_CARE_PROVIDER_SITE_OTHER): Payer: Medicare Other | Admitting: Internal Medicine

## 2016-07-23 ENCOUNTER — Encounter: Payer: Self-pay | Admitting: Internal Medicine

## 2016-07-23 VITALS — BP 174/96 | HR 62 | Temp 98.8°F | Resp 16 | Ht 65.0 in | Wt 232.6 lb

## 2016-07-23 DIAGNOSIS — E785 Hyperlipidemia, unspecified: Secondary | ICD-10-CM

## 2016-07-23 DIAGNOSIS — I10 Essential (primary) hypertension: Secondary | ICD-10-CM | POA: Diagnosis not present

## 2016-07-23 DIAGNOSIS — G4730 Sleep apnea, unspecified: Secondary | ICD-10-CM

## 2016-07-23 DIAGNOSIS — R739 Hyperglycemia, unspecified: Secondary | ICD-10-CM

## 2016-07-23 LAB — LIPID PANEL
CHOLESTEROL: 160 mg/dL (ref 0–200)
HDL: 48.5 mg/dL (ref >39.00)
LDL Cholesterol: 94 mg/dL (ref 0–99)
NonHDL: 111.68
TRIGLYCERIDES: 86 mg/dL (ref 0.0–149.0)
Total CHOL/HDL Ratio: 3
VLDL: 17.2 mg/dL (ref 0.0–40.0)

## 2016-07-23 LAB — BASIC METABOLIC PANEL
BUN: 13 mg/dL (ref 6–23)
CO2: 28 mEq/L (ref 19–32)
CREATININE: 0.86 mg/dL (ref 0.40–1.20)
Calcium: 9.8 mg/dL (ref 8.4–10.5)
Chloride: 104 mEq/L (ref 96–112)
GFR: 90.3 mL/min (ref >60.00)
Glucose, Bld: 99 mg/dL (ref 70–99)
Potassium: 3.8 mEq/L (ref 3.5–5.1)
Sodium: 139 mEq/L (ref 135–145)

## 2016-07-23 LAB — HEMOGLOBIN A1C: HEMOGLOBIN A1C: 6.1 % (ref 4.6–6.5)

## 2016-07-23 MED ORDER — CHLORTHALIDONE 25 MG PO TABS: 25.0000 mg | ORAL_TABLET | Freq: Every day | ORAL | 1 refills | Status: DC

## 2016-07-23 NOTE — Progress Notes (Signed)
Subjective:  Patient ID: Jasmin Hernandez, female    DOB: 1965-12-02  Age: 51 y.o. MRN: [id number]  CC: Hypertension   HPI [name] presents for a blood pressure check. She tells me her blood pressure has not been well controlled and she has had a few headaches. Fortunately, she quit smoking about 2 weeks ago. Her husband complains about her snoring and she thinks she has sleep apnea. She is taking bysystolic. She's had no recent episodes of blurred vision, chest pain, shortness of breath, edema, or fatigue.  Outpatient Medications Prior to Visit  Medication Sig Dispense Refill  . BYSTOLIC 10 MG tablet TAKE 1 TABLET BY MOUTH ONCE DAILY (Patient taking differently: TAKE 1 TABLET BY MOUTH ONCE DAILY---  TAKES IN AM) 90 tablet 0  . ibuprofen (ADVIL,MOTRIN) 200 MG tablet Take 400 mg by mouth every 6 (six) hours as needed for headache.    . rosuvastatin (CRESTOR) 20 MG tablet Take 1 tablet (20 mg total) by mouth daily. (Patient taking differently: Take 20 mg by mouth every morning. ) 90 tablet 3  . oxyCODONE-acetaminophen (PERCOCET) 7.5-325 MG tablet Take 1 tablet by mouth every 4 (four) hours as needed for severe pain. (Patient not taking: Reported on 06/11/2016) 12 tablet 0   No facility-administered medications prior to visit.     ROS Review of Systems  Constitutional: Negative.  Negative for diaphoresis, fatigue and unexpected weight change.  HENT: Negative.   Eyes: Negative for visual disturbance.  Respiratory: Positive for apnea. Negative for cough, chest tightness, shortness of breath and wheezing.        She has heavy snoring and her husband tells her that she stops breathing at night  Cardiovascular: Negative for chest pain, palpitations and leg swelling.  Gastrointestinal: Negative for abdominal pain, constipation, diarrhea, nausea and vomiting.  Genitourinary: Negative.  Negative for difficulty urinating, dysuria, hematuria and urgency.  Musculoskeletal: Negative.   Negative for back pain and neck pain.  Skin: Negative.   Neurological: Positive for headaches. Negative for dizziness, facial asymmetry, weakness and light-headedness.  Hematological: Negative for adenopathy. Does not bruise/bleed easily.  Psychiatric/Behavioral: Negative.     Objective:  BP (!) 174/96 (BP Location: Left Arm, Patient Position: Sitting, Cuff Size: Large)   Pulse 62   Temp 98.8 F (37.1 C) (Oral)   Resp 16   Ht 5\' 5"  (1.651 m)   Wt 232 lb 10 oz (105.5 kg)   LMP 08/21/2010   SpO2 100%   BMI 38.71 kg/m   BP Readings from Last 3 Encounters:  07/23/16 (!) 174/96  06/11/16 (!) 161/98  04/08/16 (!) 153/89    Wt Readings from Last 3 Encounters:  07/23/16 232 lb 10 oz (105.5 kg)  04/08/16 241 lb (109.3 kg)  01/02/16 234 lb 4 oz (106.3 kg)    Physical Exam  Constitutional: She is oriented to person, place, and time. No distress.  HENT:  Mouth/Throat: Oropharynx is clear and moist. No oropharyngeal exudate.  Eyes: Conjunctivae and EOM are normal. Pupils are equal, round, and reactive to light. Right eye exhibits no discharge. Left eye exhibits no discharge. No scleral icterus.  Neck: Normal range of motion. Neck supple. No JVD present. No thyromegaly present.  Cardiovascular: Normal rate and regular rhythm.  Exam reveals no gallop.   No murmur heard. Pulmonary/Chest: Effort normal and breath sounds normal. No respiratory distress. She has no wheezes. She has no rales. She exhibits no tenderness.  Abdominal: Soft. Bowel sounds are normal. She exhibits  no mass. There is no tenderness.  Musculoskeletal: Normal range of motion. She exhibits no edema or tenderness.  Neurological: She is alert and oriented to person, place, and time. She has normal reflexes. She displays normal reflexes. No cranial nerve deficit. She exhibits normal muscle tone. Coordination normal.  Skin: Skin is warm and dry. No rash noted. She is not diaphoretic. No erythema. No pallor.  Vitals  reviewed.   Lab Results  Component Value Date   WBC 8.1 04/05/2016   HGB 13.2 04/05/2016   HCT 38.4 04/05/2016   PLT 190 04/05/2016   GLUCOSE 99 07/23/2016   CHOL 160 07/23/2016   TRIG 86.0 07/23/2016   HDL 48.50 07/23/2016   LDLDIRECT 157.0 03/25/2014   LDLCALC 94 07/23/2016   ALT 28 09/19/2015   AST 26 09/19/2015   NA 139 07/23/2016   K 3.8 07/23/2016   CL 104 07/23/2016   CREATININE 0.86 07/23/2016   BUN 13 07/23/2016   CO2 28 07/23/2016   TSH 0.96 07/23/2016   INR 0.94 12/07/2010   HGBA1C 6.1 07/23/2016    No results found.  Assessment & Plan:   Agape was seen today for hypertension.  Diagnoses and all orders for this visit:  Essential hypertension- Her blood pressure is not well controlled, I will add a thiazide diuretic to the Bystolic. Her labs are negative for any secondary causes or end organ damage. I think it's imperative that she be tested for sleep apnea. -     Basic metabolic panel; Future -     Thyroid Panel With TSH; Future -     chlorthalidone (HYGROTON) 25 MG tablet; Take 1 tablet (25 mg total) by mouth daily.  Hyperglycemia- her A1C is at 6.1%, she is prediabetic, a medication is not needed to treat this but she does agree to work on her lifestyle modifications. -     Hemoglobin A1c; Future  Hyperlipidemia with target LDL less than 130- she has achieved her LDL goal is doing well on the statin. -     Lipid panel; Future  Sleep apnea, unspecified type- I've asked her to see sleep medicine to have this evaluated and treated. -     Ambulatory referral to Sleep Studies   I have discontinued Ms. Donlan's oxyCODONE-acetaminophen. I am also having her start on chlorthalidone. Additionally, I am having her maintain her rosuvastatin, BYSTOLIC, and ibuprofen.  Meds ordered this encounter  Medications  . chlorthalidone (HYGROTON) 25 MG tablet    Sig: Take 1 tablet (25 mg total) by mouth daily.    Dispense:  90 tablet    Refill:  1     Follow-up:  Return in about 3 weeks (around 08/13/2016).  Scarlette Calico, MD

## 2016-07-23 NOTE — Patient Instructions (Signed)

## 2016-07-24 LAB — THYROID PANEL WITH TSH
FREE THYROXINE INDEX: 2.2 (ref 1.4–3.8)
T3 Uptake: 26 % (ref 22–35)
T4, Total: 8.6 ug/dL (ref 4.5–12.0)
TSH: 0.96 mIU/L

## 2016-07-25 ENCOUNTER — Ambulatory Visit (INDEPENDENT_AMBULATORY_CARE_PROVIDER_SITE_OTHER): Payer: Medicare Other | Admitting: Neurology

## 2016-07-25 ENCOUNTER — Encounter: Payer: Self-pay | Admitting: Neurology

## 2016-07-25 VITALS — BP 136/89 | HR 75 | Resp 20 | Ht 65.0 in | Wt 229.0 lb

## 2016-07-25 DIAGNOSIS — R0689 Other abnormalities of breathing: Secondary | ICD-10-CM | POA: Diagnosis not present

## 2016-07-25 DIAGNOSIS — F519 Sleep disorder not due to a substance or known physiological condition, unspecified: Secondary | ICD-10-CM

## 2016-07-25 DIAGNOSIS — F3130 Bipolar disorder, current episode depressed, mild or moderate severity, unspecified: Secondary | ICD-10-CM

## 2016-07-25 DIAGNOSIS — R0683 Snoring: Secondary | ICD-10-CM | POA: Diagnosis not present

## 2016-07-25 DIAGNOSIS — F5109 Other insomnia not due to a substance or known physiological condition: Secondary | ICD-10-CM

## 2016-07-25 DIAGNOSIS — F319 Bipolar disorder, unspecified: Secondary | ICD-10-CM

## 2016-07-25 NOTE — Patient Instructions (Signed)

## 2016-07-25 NOTE — Progress Notes (Signed)
SLEEP MEDICINE CLINIC   Provider:  Larey Seat, M D  Primary Care Physician:  Janith Lima, MD   Referring Provider: Janith Lima, MD    Chief Complaint  Patient presents with  . New Patient (Initial Visit)    gasps for air during sleep, morning headaches    HPI:  Jasmin Hernandez is a 51 y.o. female , seen here as in a referral from Dr. Ronnald Ramp for A sleep consultation. [name] has a known history of hypertension and has been treated with by systolic, she has a history of breast cancer which was diagnosed in September 2011 after which she underwent mastectomy and lumpectomy in October 2011 and later underwent an oophorectomy without hysterectomy.  She reports waking up with headaches every morning, may be related to high blood pressure in the morning. The headaches just begun 2 weeks ago. She also reports that sometimes she has to gasp for air sometimes she kicks in her sleep is witnessed by her family. Her soon to be ex-husband stated that she moves frequently in her sleep . She breathes  irregularly in her sleep and often the patient dreams that she is falling and wakes up after a twitching or jerking movement.  Chief complaint according to patient : The patient wakes up from her own snoring, gasping for air, and from sudden jerking movements during sleep.  Sleep habits are as follows:The patient goes to bed between 8 and 9 PM, but she will wake up at 10:30 PM after only 1-2 1/2 hours of sleep. Her bedroom is comfortable, cool, quiet and dark. She sleeps alone usually, she sleeps mostly on her back and on her side. She sleeps on only one pillow. Some nights she is able to fall right asleep back after an arousal of the night she may struggle. On average she wakes up 3 times at night, is not always clear what triggers the arousal. Headaches do not wake her but at present when she wakes up. Sleep is also additionally interrupted 2 times for the urge to urinate. She wakes up  routinely at 5 AM after 6-7 hours of sleep. She wakes up spontaneously. She wakes up with headaches that she describes as bifrontal and bitemporal distribution, bright lights hurt her eyes, as well as loud sounds. She does not report nausea. She feels her head is congested ( sinus?)   Sleep medical history and family sleep history:  Sister with OSA, snoring. Mother deceased: pancreatic cancer.  She has one son that is a sleep walker-talker. She is not aware of having sleepwalking, talking, night terrors or enuresis herself as a child. Bipolar disorder, had manic and depressive phases.   Social history: She has 2 sisters one living, and 2 brothers. Her mother had diabetes but died of pancreatic cancer at the age of 10. She has 12 children, 12 biological children. The oldest is 30 years old the youngest is 22. Amongst the children with one set of twin daughters now 85. [name] used to live in [place], she quit smoking Memorial Day weekend May 24, she also had her last drink of wine that weekend. she drinks a lot of caffeine aged beverages especially iced tea, hot green tea. She does not drink coffee and no sodas. She drinks water.  Review of Systems: Out of a complete 14 system review, the patient complains of only the following symptoms, and all other reviewed systems are negative. Anxiety.  [name] also stated that she has  bipolar disorder, frequently feels short of breath, she knows she snoring and has been told so, sometimes she flushes and feels hot, she has often headaches she feels restless and changes her sleep position a lot and she has a history of depression and anxiety as well as high cholesterol and hypertension. Breast cancer.   Epworth score  11 , Fatigue severity score 34  , depression score.   Social History   Social History  . Marital status: Single    Spouse name: N/A  . Number of children: N/A  . Years of education: N/A   Occupational History  . disabled  Unemployed   Social History Main Topics  . Smoking status: Former Smoker    Packs/day: 0.50    Years: 30.00    Types: Cigarettes    Quit date: 07/11/2016  . Smokeless tobacco: Never Used  . Alcohol use No     Comment: occasional  . Drug use: No  . Sexual activity: Yes    Birth control/ protection: Post-menopausal   Other Topics Concern  . Not on file   Social History Narrative   Regular exercise- No    Family History  Problem Relation Age of Onset  . Cancer Mother   . Cancer Sister   . Mental retardation Other   . Hypertension Other   . Drug abuse Other   . Diabetes Other   . Arthritis Other   . Alcohol abuse Other   . Cancer Other        ovarian and uterine    Past Medical History:  Diagnosis Date  . Anxiety   . Bipolar 1 disorder, mixed, moderate (HCC)    per pt's pcp note's  . Depression   . Endometrial polyp   . 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 History of ovarian cyst    right side  s/p  bso 12-07-2010  . Hypertension   . IBS (irritable bowel syndrome)     Past Surgical History:  Procedure Laterality Date  . BREAST BIOPSY  01/16/2011   Procedure: BREAST BIOPSY;  Surgeon: Imogene Burn. Georgette Dover, MD;  Location: [facility];  Service: General;  Laterality: Right;   . DILATATION & CURETTAGE/HYSTEROSCOPY WITH MYOSURE N/A 04/08/2016   Procedure: DILATATION & CURETTAGE/HYSTEROSCOPY WITH MYOSURE;  Surgeon: Arvella Nigh, MD;  Location: [facility];  Service: Gynecology;  Laterality: N/A;  . LAPAROSCOPIC TUBAL LIGATION Bilateral 06/28/2010   w/ Fulgeration  . LAPAROSCOPY  12/07/2010   Procedure: LAPAROSCOPY OPERATIVE;  Surgeon: Gus Height;  Location: [facility];  Service: Gynecology;  Laterality: N/A;  . MASTECTOMY W/ SENTINEL NODE BIOPSY Left 12/18/2009  . SALPINGOOPHORECTOMY  12/07/2010   Procedure:  SALPINGO OOPHERECTOMY;  Surgeon: Gus Height;  Location: [facility];  Service: Gynecology;  Laterality: Bilateral;    Current Outpatient Prescriptions  Medication Sig Dispense Refill  . BYSTOLIC 10 MG tablet TAKE 1 TABLET BY MOUTH ONCE DAILY (Patient taking differently: TAKE 1 TABLET BY MOUTH ONCE DAILY---  TAKES IN AM) 90 tablet 0  . chlorthalidone (HYGROTON) 25 MG tablet Take 1 tablet (25 mg total) by mouth daily. 90 tablet 1  . ibuprofen (ADVIL,MOTRIN) 200 MG tablet Take 400 mg by mouth every 6 (six) hours as needed for headache.    . rosuvastatin (CRESTOR) 20 MG tablet Take 1 tablet (  20 mg total) by mouth daily. (Patient taking differently: Take 20 mg by mouth every morning. ) 90 tablet 3   No current facility-administered medications for this visit.     Allergies as of 07/25/2016 - Review Complete 07/25/2016  Allergen Reaction Noted  . Hydrochlorothiazide Shortness Of Breath and Swelling 11/07/2009    Vitals: BP 136/89   Pulse 75   Resp 20   Ht 5\' 5"  (1.651 m)   Wt 229 lb (103.9 kg)   LMP 08/21/2010   BMI 38.11 kg/m  Last Weight:  Wt Readings from Last 1 Encounters:  07/25/16 229 lb (103.9 kg)   BPZ:WCHE mass index is 38.11 kg/m.     Last Height:   Ht Readings from Last 1 Encounters:  07/25/16 5\' 5"  (1.651 m)    Physical exam:  General: The patient is awake, alert and appears not in acute distress. The patient is well groomed. Head: Normocephalic, atraumatic. Neck is supple. Mallampati ,  neck circumference:15.5 . Nasal airflow congested  No TMJ click  . Cardiovascular:  Regular rate and rhythm , without  murmurs or carotid bruit, and without distended neck veins. Respiratory: Lungs are clear to auscultation. Skin:  Without evidence of edema, or rash, tattooes Trunk: BMI is 38.   Neurologic exam : The patient is awake and alert, oriented to place and time.   Attention span & concentration ability appears normal.  Speech is fluent,  without dysarthria, dysphonia or  aphasia.  Mood and affect are appropriate.  Cranial nerves: Pupils are equal and briskly reactive to light.  Extraocular movements  in vertical and horizontal planes intact and without nystagmus. Visual fields by finger perimetry are intact. Hearing to finger rub intact. Facial sensation intact to fine touch. Facial motor strength is symmetric and tongue and uvula move midline. Shoulder shrug was symmetrical.   Motor exam:   Normal tone, muscle bulk and symmetric strength in all extremities. Sensory:  Fine touch, pinprick and vibration were tested in all extremities. Proprioception tested in the upper extremities was normal. Coordination: Finger-to-nose maneuver  normal without evidence of ataxia, dysmetria or tremor. Gait and station: Patient walks without assistive device and is able unassisted to climb up to the exam table. Strength within normal limits.  Stance is stable and normal.   Turns with 3  Steps. Romberg testing is  negative.  Deep tendon reflexes: in the  upper and lower extremities are symmetric and intact. Babinski maneuver response is downgoing.     Assessment:  After physical and neurologic examination, review of laboratory studies,  Personal review of imaging studies, reports of other /same  Imaging studies, results of polysomnography and / or neurophysiology testing and pre-existing records as far as provided in visit., my assessment is   1)  given the clinical evidence of witnessed apneas and witnessed snoring, I have no doubt that the patient should test positive for sleep apnea. She is an elevated body mass index and Mallampati but she does not have a large neck circumference. The underlying comorbidity most likely to determine her degree of fatigue is a bipolar disease with depression. During manic phases patients usually have all the energy. During depression there is usually early morning arousals and awakenings noted. Manic patients do not sleep at all.  Morning  headaches- severe. Not clearly migrainous.   I will order a split night polysomnography for the patient. CO2 will be added for her sleep concerned of sleep headaches. I would like to check if she has  hypercapnia or hypoxemia,   PS Please note that the patient has not seen psychiatric care recently, and she also will need an eye exam once a year, which could be provided through a OD   The patient was advised of the nature of the diagnosed disorder , the treatment options and the  risks for general health and wellness arising from not treating the condition.   I spent more than 45 minutes of face to face time with the patient.  Greater than 50% of time was spent in counseling and coordination of care. We have discussed the diagnosis and differential and I answered the patient's questions.    Plan:  Treatment plan and additional workup :  SPLIT night with CO2.    Larey Seat, MD 09/25/1101, 1:59 PM  Certified in Neurology by ABPN Certified in [place] by [facility] [address], [place], [place] [address]

## 2016-08-12 ENCOUNTER — Ambulatory Visit (INDEPENDENT_AMBULATORY_CARE_PROVIDER_SITE_OTHER): Payer: Medicare Other | Admitting: Neurology

## 2016-08-12 DIAGNOSIS — R0683 Snoring: Secondary | ICD-10-CM

## 2016-08-12 DIAGNOSIS — F519 Sleep disorder not due to a substance or known physiological condition, unspecified: Secondary | ICD-10-CM

## 2016-08-12 DIAGNOSIS — F5109 Other insomnia not due to a substance or known physiological condition: Secondary | ICD-10-CM

## 2016-08-12 DIAGNOSIS — F319 Bipolar disorder, unspecified: Secondary | ICD-10-CM

## 2016-08-12 DIAGNOSIS — G478 Other sleep disorders: Secondary | ICD-10-CM | POA: Diagnosis not present

## 2016-08-12 DIAGNOSIS — R0689 Other abnormalities of breathing: Secondary | ICD-10-CM

## 2016-08-13 ENCOUNTER — Ambulatory Visit: Payer: Medicare Other | Admitting: Internal Medicine

## 2016-08-20 ENCOUNTER — Ambulatory Visit (INDEPENDENT_AMBULATORY_CARE_PROVIDER_SITE_OTHER): Payer: Medicare Other | Admitting: Internal Medicine

## 2016-08-20 ENCOUNTER — Encounter: Payer: Self-pay | Admitting: Internal Medicine

## 2016-08-20 VITALS — BP 120/80 | HR 80 | Temp 98.3°F | Ht 65.0 in | Wt 229.1 lb

## 2016-08-20 DIAGNOSIS — I10 Essential (primary) hypertension: Secondary | ICD-10-CM | POA: Diagnosis not present

## 2016-08-20 NOTE — Progress Notes (Signed)
Subjective:  Patient ID: Jasmin Hernandez, female    DOB: 04/19/1965  Age: 51 y.o. MRN: [id number]  CC: Hypertension   HPI Jasmin Hernandez presents for a BP check - she feels well and offers no complaints. Her BP at home has been well controlled.  Outpatient Medications Prior to Visit  Medication Sig Dispense Refill  . BYSTOLIC 10 MG tablet TAKE 1 TABLET BY MOUTH ONCE DAILY (Patient taking differently: TAKE 1 TABLET BY MOUTH ONCE DAILY---  TAKES IN AM) 90 tablet 0  . chlorthalidone (HYGROTON) 25 MG tablet Take 1 tablet (25 mg total) by mouth daily. 90 tablet 1  . ibuprofen (ADVIL,MOTRIN) 200 MG tablet Take 400 mg by mouth every 6 (six) hours as needed for headache.    . rosuvastatin (CRESTOR) 20 MG tablet Take 1 tablet (20 mg total) by mouth daily. (Patient taking differently: Take 20 mg by mouth every morning. ) 90 tablet 3   No facility-administered medications prior to visit.     ROS Review of Systems  Constitutional: Negative for diaphoresis and fatigue.  HENT: Negative.   Eyes: Negative for visual disturbance.  Respiratory: Negative.  Negative for chest tightness, shortness of breath, wheezing and stridor.   Cardiovascular: Negative.  Negative for chest pain, palpitations and leg swelling.  Gastrointestinal: Negative.  Negative for abdominal pain, constipation, diarrhea and vomiting.  Endocrine: Negative.   Genitourinary: Negative.   Musculoskeletal: Negative.  Negative for back pain and neck pain.  Skin: Negative.   Allergic/Immunologic: Negative.   Neurological: Negative.  Negative for dizziness, weakness and headaches.  Hematological: Negative.   Psychiatric/Behavioral: Negative.     Objective:  BP 120/80 (BP Location: Left Arm, Patient Position: Sitting, Cuff Size: Normal)   Pulse 80   Temp 98.3 F (36.8 C) (Oral)   Ht 5\' 5"  (1.651 m)   Wt 229 lb 2 oz (103.9 kg)   LMP 08/21/2010   SpO2 99%   BMI 38.13 kg/m   BP Readings from Last 3 Encounters:  08/20/16  120/80  07/25/16 136/89  07/23/16 (!) 174/96    Wt Readings from Last 3 Encounters:  08/20/16 229 lb 2 oz (103.9 kg)  07/25/16 229 lb (103.9 kg)  07/23/16 232 lb 10 oz (105.5 kg)    Physical Exam  Constitutional: She is oriented to person, place, and time. No distress.  HENT:  Mouth/Throat: Oropharynx is clear and moist. No oropharyngeal exudate.  Eyes: Conjunctivae are normal. Right eye exhibits no discharge. Left eye exhibits no discharge. No scleral icterus.  Neck: Normal range of motion. Neck supple. No JVD present. No thyromegaly present.  Cardiovascular: Normal rate, regular rhythm and intact distal pulses.  Exam reveals no gallop.   No murmur heard. Pulmonary/Chest: Effort normal and breath sounds normal. No respiratory distress. She has no wheezes. She has no rales. She exhibits no tenderness.  Abdominal: Soft. Bowel sounds are normal. She exhibits no distension and no mass. There is no tenderness. There is no rebound and no guarding.  Musculoskeletal: Normal range of motion. She exhibits no edema, tenderness or deformity.  Lymphadenopathy:    She has no cervical adenopathy.  Neurological: She is alert and oriented to person, place, and time.  Skin: Skin is warm and dry. No rash noted. She is not diaphoretic. No erythema. No pallor.  Psychiatric: She has a normal mood and affect. Her behavior is normal. Judgment and thought content normal.  Vitals reviewed.   Lab Results  Component Value Date   WBC  8.1 04/05/2016   HGB 13.2 04/05/2016   HCT 38.4 04/05/2016   PLT 190 04/05/2016   GLUCOSE 99 07/23/2016   CHOL 160 07/23/2016   TRIG 86.0 07/23/2016   HDL 48.50 07/23/2016   LDLDIRECT 157.0 03/25/2014   LDLCALC 94 07/23/2016   ALT 28 09/19/2015   AST 26 09/19/2015   NA 139 07/23/2016   K 3.8 07/23/2016   CL 104 07/23/2016   CREATININE 0.86 07/23/2016   BUN 13 07/23/2016   CO2 28 07/23/2016   TSH 0.96 07/23/2016   INR 0.94 12/07/2010   HGBA1C 6.1 07/23/2016     No results found.  Assessment & Plan:   Jasmin Hernandez was seen today for hypertension.  Diagnoses and all orders for this visit:  Essential hypertension- her BP is well controlled on the current regimen, will continue   I am having Jasmin Hernandez maintain her rosuvastatin, BYSTOLIC, ibuprofen, and chlorthalidone.  No orders of the defined types were placed in this encounter.    Follow-up: No Follow-up on file.  Scarlette Calico, MD

## 2016-08-21 NOTE — Patient Instructions (Signed)

## 2016-08-30 NOTE — Procedures (Signed)
PATIENT'S NAME:  Jasmin Hernandez, Guagliardo DOB:      1966/01/13      MR#:    [id number]     DATE OF RECORDING: 08/12/2016 REFERRING M.D.:  Scarlette Calico, MD Study Performed:   Baseline Polysomnogram HISTORY:  Mrs. Hohmann has a known history of hypertension and has a history of breast cancer which was diagnosed in September 2011 after which she underwent mastectomy and lumpectomy in October 2011 and later underwent an oophorectomy without hysterectomy. She has been morbidly obese. She reports waking up with headaches every morning, may be related to high blood pressure. The headaches just begun 5 weeks ago. She also reports that she has to gasp for air, sometimes she kicks in her sleep as witnessed by her family and husband. She breathes irregularly in her sleep and often dreams that she is falling and wakes up after a twitching or jerking movement. The patient endorsed the Epworth Sleepiness Scale at 11/24 points.   The patient's weight 229 pounds with a height of 65 (inches), resulting in a BMI of 38.2 kg/m2. The patient's neck circumference measured 15.5 inches.  CURRENT MEDICATIONS: Bystolic, Chlorthalidone, Ibuprofen and Rosuvastatin   PROCEDURE:  This is a multichannel digital polysomnogram utilizing the Somnostar 11.2 system.  Electrodes and sensors were applied and monitored per AASM Specifications.   EEG, EOG, Chin and Limb EMG, were sampled at 200 Hz.  ECG, Snore and Nasal Pressure, Thermal Airflow, Respiratory Effort, CPAP Flow and Pressure, Oximetry was sampled at 50 Hz. Digital video and audio were recorded.      BASELINE STUDY Lights Out was at 20:44 and Lights On at 05:02.  Total recording time (TRT) was 498.5 minutes, with a total sleep time (TST) of 338 minutes.   The patient's sleep latency was 58 minutes.  REM latency was 53 minutes.  The sleep efficiency was 67.8 %.     SLEEP ARCHITECTURE: WASO (Wake after sleep onset) was 107.5 minutes.  There were 23.5 minutes in Stage N1, 226.5 minutes  Stage N2, 0 minutes Stage N3 and 88 minutes in Stage REM.  The percentage of Stage N1 was 7.%, Stage N2 was 67.%, Stage N3 was 0% and Stage R (REM sleep) was 26.1%.   RESPIRATORY ANALYSIS:  There were a total of 4 respiratory events:  0 apneas and 4 hypopneas with 0 respiratory event related arousals (RERAs).     The total APNEA/HYPOPNEA INDEX (AHI) was 0.7/hour and the total RESPIRATORY DISTURBANCE INDEX was 0.7 /hour.  3 events occurred in REM sleep and 2 events in NREM. The REM AHI was 2. /hour, versus a non-REM AHI of 0.24. The patient spent 68 minutes of total sleep time in the supine position and 270 minutes in non-supine. The supine AHI was 1.8 versus a non-supine AHI of 0.4.  OXYGEN SATURATION & C02:  The Wake baseline 02 saturation was 98%, with the lowest being 88%. Time spent below 89% saturation equaled 0.8 minutes. Average End Tidal CO2 during sleep was not determined- due to artefact      PERIODIC LIMB MOVEMENTS:  The patient had a total of 17 Periodic Limb Movements.  The Periodic Limb Movement (PLM) index was 3.0 and the PLM Arousal index was 0.9/hour. The arousals were noted as: 33 were spontaneous, 5 were associated with PLMs, and 4 were associated with respiratory events. Audio and video analysis did not show any abnormal or unusual movements, behaviors, phonations or vocalizations.    The patient took one bathroom breaks. Snoring was  not noted. EKG was in keeping with normal sinus rhythm (NSR). Post-study, the patient indicated that sleep was the same as usual.    IMPRESSION:  Reduced sleep efficiency without evidence of organic sleep disorder.  RECOMMENDATIONS:  1. Consider dedicated sleep psychology referral if insomnia is of clinical concern. We were unable to find an explanation for her headaches.  Unfortunately, the CO2 measures were invalid- leaving the option of a blood gas analysis. Hypercapnia without hypoxemia is rare.  2. A follow up appointment will not be  scheduled unless the patient insist- ( [facility] at [facility]). The referring provider will be notified of the results.      I certify that I have reviewed the entire raw data recording prior to the issuance of this report in accordance with the Standards of Accreditation of the American Academy of Sleep Medicine (AASM)    Larey Seat, MD     08-30-2016 Diplomat, American Board of Psychiatry and Neurology  Diplomat, American Board of [place] Director, [employer] [employer]

## 2016-09-02 ENCOUNTER — Telehealth: Payer: Self-pay | Admitting: Neurology

## 2016-09-02 NOTE — Telephone Encounter (Signed)
Called pt and discussed the sleep study results. I explained that Dr Brett Fairy stated there was no evidence of sleep apnea and no episode of lower oxygen noted during the sleep study. I mentioned to her that per Dr Dohmeier's recommendation the pt could have a psych sleep referral for insomnia. Pt declined at this time.

## 2016-09-02 NOTE — Telephone Encounter (Signed)
-----   Message from Larey Seat, MD sent at 08/30/2016  2:56 PM EDT ----- IMPRESSION:  Reduced sleep efficiency without evidence of organic sleep  Disorder. No evidence  of apnea, significant  PLM disorder, heart rate abnormalities.    RECOMMENDATIONS:  1. Consider dedicated sleep psychology referral if insomnia is of  clinical concern. We were unable to find an explanation for her  headaches. Unfortunately, the CO2 measures were invalid- leaving  the option of a blood gas analysis. Hypercapnia without hypoxemia  is rare.

## 2016-09-13 ENCOUNTER — Telehealth: Payer: Self-pay | Admitting: Internal Medicine

## 2016-09-13 DIAGNOSIS — I10 Essential (primary) hypertension: Secondary | ICD-10-CM

## 2016-09-13 MED ORDER — NEBIVOLOL HCL 10 MG PO TABS: 10.0000 mg | ORAL_TABLET | Freq: Every morning | ORAL | 1 refills | Status: AC

## 2016-09-13 NOTE — Telephone Encounter (Signed)
Patient requesting refill on bystolic to be sent to [facility] on [address].

## 2016-09-13 NOTE — Telephone Encounter (Signed)
Reviewed chart pt is up-to-date notified pt sent refills to [facility].Marland KitchenJohny Chess

## 2016-09-16 ENCOUNTER — Other Ambulatory Visit: Payer: Self-pay | Admitting: Internal Medicine

## 2016-09-16 DIAGNOSIS — E785 Hyperlipidemia, unspecified: Secondary | ICD-10-CM

## 2016-09-23 DIAGNOSIS — N644 Mastodynia: Secondary | ICD-10-CM | POA: Diagnosis not present

## 2016-10-07 DIAGNOSIS — N644 Mastodynia: Secondary | ICD-10-CM | POA: Diagnosis not present

## 2016-10-18 ENCOUNTER — Telehealth: Payer: Self-pay | Admitting: Internal Medicine

## 2016-10-18 NOTE — Telephone Encounter (Signed)
Patient Name: Jasmin Hernandez DOB: Mar 11, 1965 Initial Comment Caller states, she is having pain in right breast for 2-3 weeks. She lifted a dresser outside. Breast spasms. Seen by OBGYN - had breast cancer in the past. Pulled muscle in chest. Burning sensation in shoulder, ribs and chest. Needing an appt. Nurse Assessment Nurse: Zorita Pang, RN, Deborah Date/Time (Eastern Time): 10/18/2016 8:45:21 AM Confirm and document reason for call. If symptomatic, describe symptoms. ---The patient stated that she recently did some heavy lifting and she had some sharp pain in her right breast. She states that she felt like spasms in her right breast area. After about a week she saw her OB-Gyn and he told her that he didn't feel anything. She had a mammogram in January and it was ok. Hx of breast cancer in left breast. S/P mastectomy. Her OB/GYN told her to wait about 2 weeks and if she is still having problems he would [facility]. She has an appointment for the 3rd. She states that she is having more pain in her breast and now in her mastectomy site. Does the patient have any new or worsening symptoms? ---Yes Will a triage be completed? ---Yes Related visit to physician within the last 2 weeks? ---Yes Does the PT have any chronic conditions? (i.e. diabetes, asthma, etc.) ---Yes List chronic conditions. ---high blood pressure and high cholesterol Is the patient pregnant or possibly pregnant? (Ask all females between the ages of 82-55) ---No Is this a behavioral health or substance abuse call? ---No Guidelines Guideline Title Affirmed Question Affirmed Notes Breast Symptoms [1] Breast pain AND [2] cause is not known Final Disposition User See PCP within 2 Weeks W[place]ble, RN, Neoma Laming Comments This nurse offered to call and see if the patient can get seen today but she states that she doesn't have transportation. This nurse instructed her to rest the chest and try some ice  periodically for 20 minutes periodically. She was also instructed to try heat if the ice did not heal but again no longer than 20 minute periods. The patient stated that her OB/Gyn doctor told her to rest the muscle but she states that she has things to do around the house and she doesn't have a man to help her.

## 2016-10-28 DIAGNOSIS — N6459 Other signs and symptoms in breast: Secondary | ICD-10-CM | POA: Diagnosis not present

## 2016-10-29 ENCOUNTER — Other Ambulatory Visit: Payer: Self-pay | Admitting: Obstetrics and Gynecology

## 2016-10-29 DIAGNOSIS — N644 Mastodynia: Secondary | ICD-10-CM

## 2016-10-30 ENCOUNTER — Other Ambulatory Visit: Payer: Medicare Other

## 2016-11-06 ENCOUNTER — Inpatient Hospital Stay: Admission: RE | Admit: 2016-11-06 | Payer: Medicare Other | Source: Ambulatory Visit

## 2016-11-22 ENCOUNTER — Ambulatory Visit
Admission: RE | Admit: 2016-11-22 | Discharge: 2016-11-22 | Disposition: A | Discharge status: Home / Self Care | Payer: Medicare Other | Source: Ambulatory Visit | Attending: Obstetrics and Gynecology | Admitting: Obstetrics and Gynecology

## 2016-11-22 ENCOUNTER — Other Ambulatory Visit: Payer: Self-pay | Admitting: Obstetrics and Gynecology

## 2016-11-22 DIAGNOSIS — N6489 Other specified disorders of breast: Secondary | ICD-10-CM | POA: Diagnosis not present

## 2016-11-22 DIAGNOSIS — N644 Mastodynia: Secondary | ICD-10-CM

## 2016-11-22 DIAGNOSIS — R922 Inconclusive mammogram: Secondary | ICD-10-CM | POA: Diagnosis not present

## 2016-12-12 ENCOUNTER — Other Ambulatory Visit: Payer: Self-pay | Admitting: Internal Medicine

## 2016-12-12 DIAGNOSIS — I10 Essential (primary) hypertension: Secondary | ICD-10-CM

## 2017-03-13 ENCOUNTER — Other Ambulatory Visit: Payer: Self-pay | Admitting: Internal Medicine

## 2017-03-13 DIAGNOSIS — E785 Hyperlipidemia, unspecified: Secondary | ICD-10-CM

## 2018-05-01 IMAGING — DX DG CHEST 2V
2 series · 2 of 2 positions shown · non-contrast
Comparison: PA and lateral chest x-ray December 15, 2009

CLINICAL DATA: Productive cough and chills for the past 2 days.
Current smoker. History of breast malignancy

EXAM:
CHEST  2 VIEW

[chest pa]
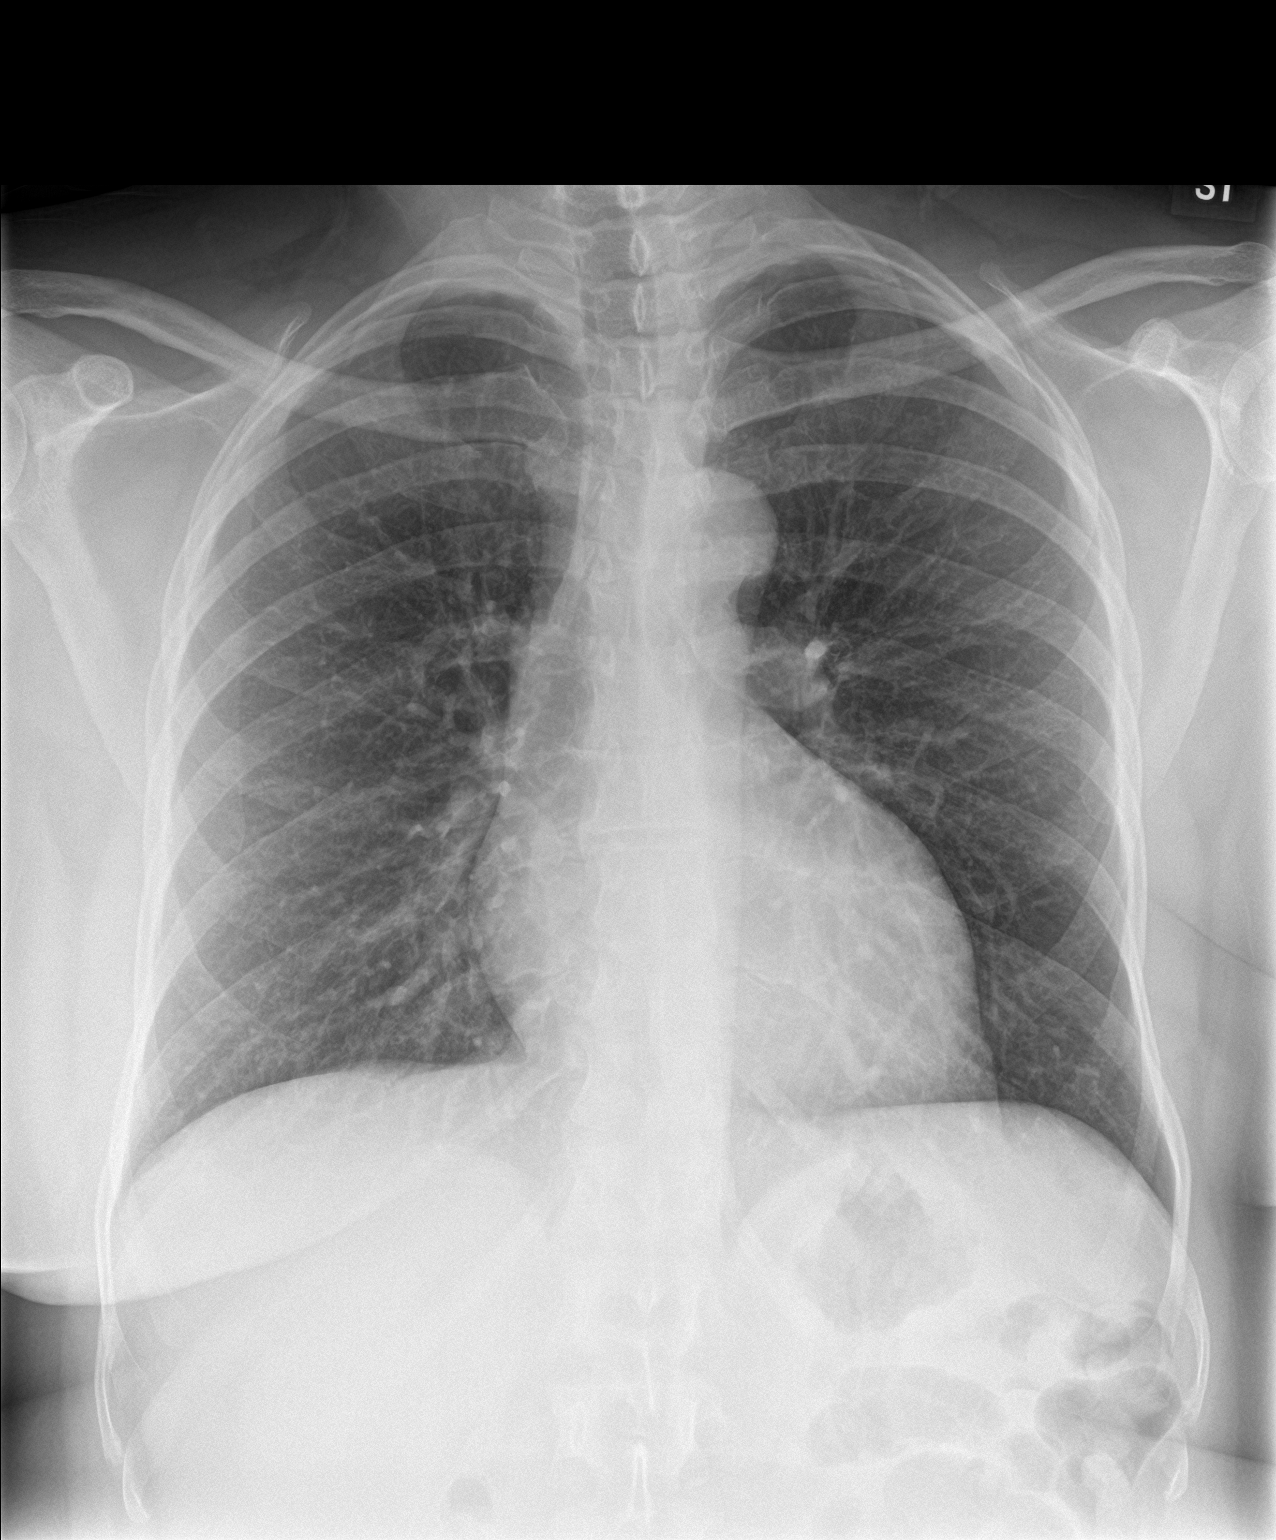

[chest lat]
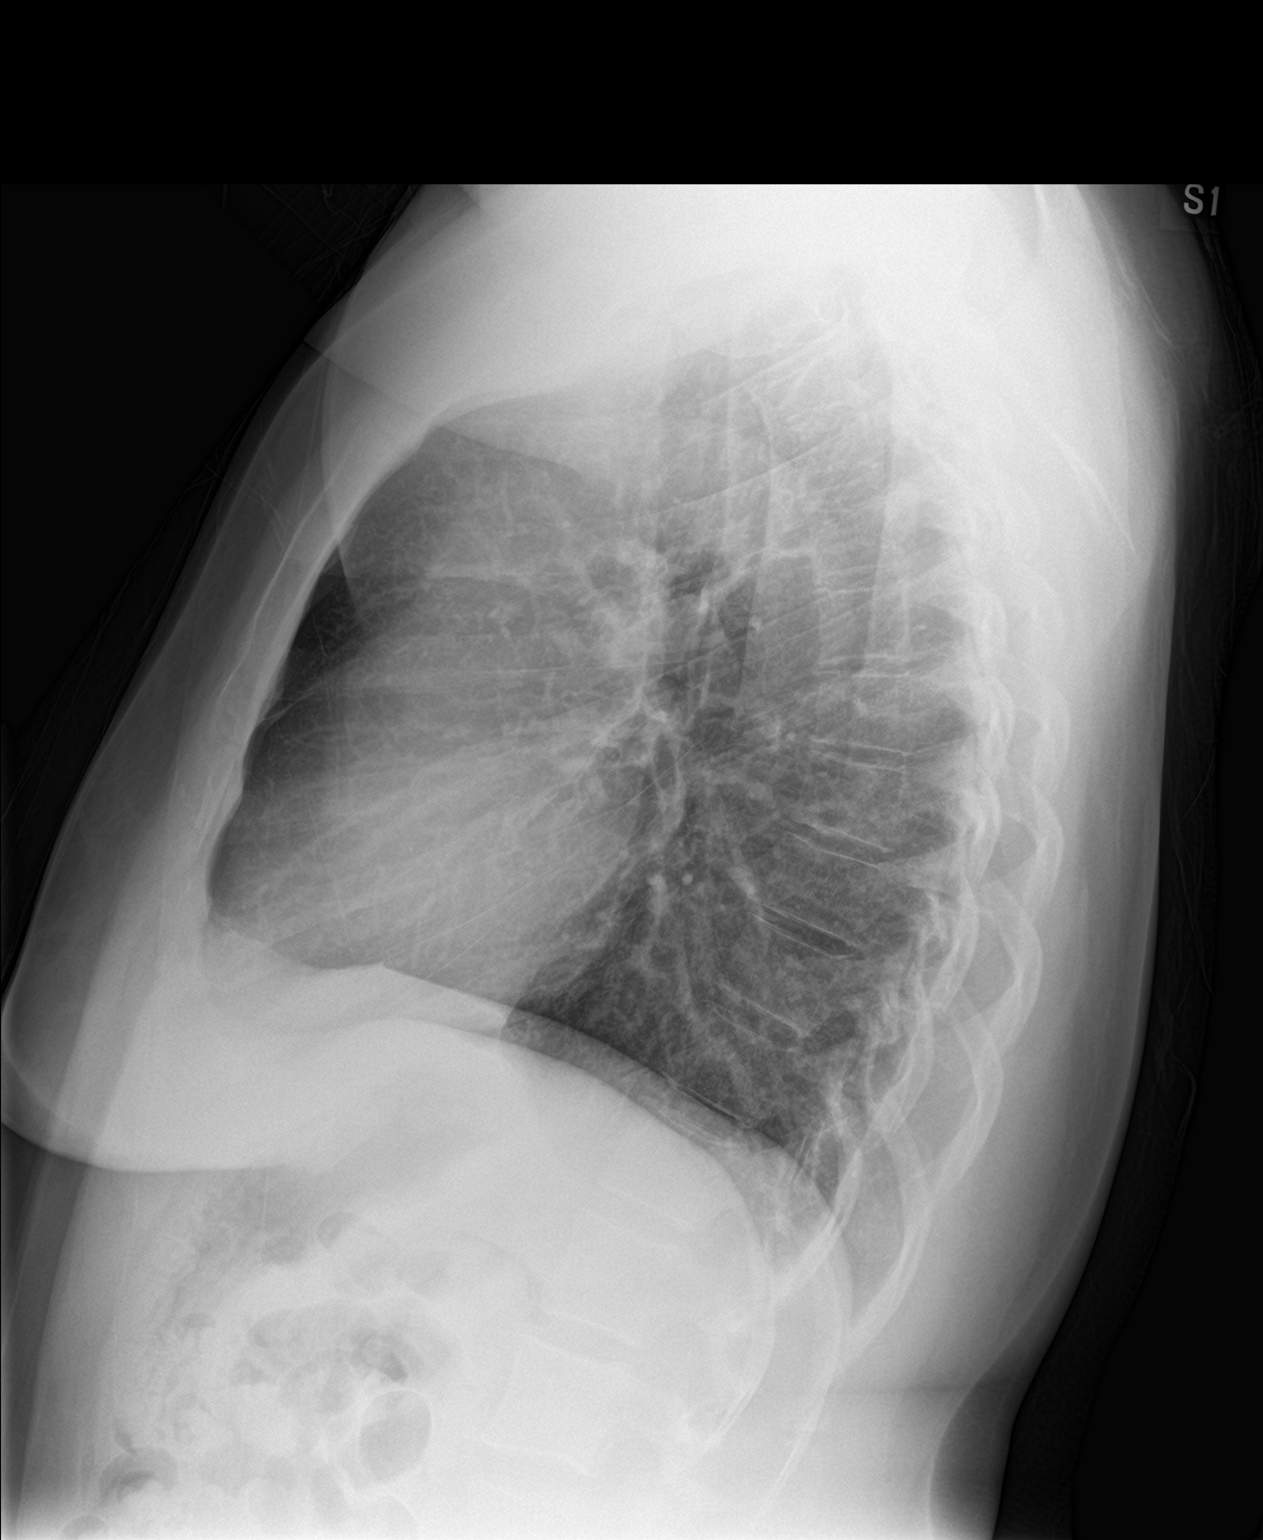

[2 of 2 positions shown; findings below may reference images not displayed]

FINDINGS: The lungs are well-expanded. There is no focal infiltrate. The
interstitial markings are coarse. The heart and pulmonary
vascularity are normal. There is calcification in the wall of the
aortic arch. The bony thorax is unremarkable.
IMPRESSION: No pneumonia nor CHF. No findings suspicious for metastatic disease.
Mild interstitial prominence may reflect the patient's smoking
history or may reflect acute bronchitis.

Thoracic aortic atherosclerosis.
